# Patient Record
Sex: Male | Born: 1960 | Race: White | Hispanic: No | Smoking: Never smoker
Health system: Southern US, Community
[De-identification: ages and names within clinical notes are randomized; demographics above are authoritative.]

## PROBLEM LIST (undated history)

## (undated) DIAGNOSIS — E349 Endocrine disorder, unspecified: Secondary | ICD-10-CM

## (undated) DIAGNOSIS — E785 Hyperlipidemia, unspecified: Secondary | ICD-10-CM

## (undated) DIAGNOSIS — I10 Essential (primary) hypertension: Secondary | ICD-10-CM

## (undated) DIAGNOSIS — T7840XA Allergy, unspecified, initial encounter: Secondary | ICD-10-CM

## (undated) HISTORY — DX: Allergy, unspecified, initial encounter: T78.40XA

## (undated) HISTORY — DX: Endocrine disorder, unspecified: E34.9

## (undated) HISTORY — DX: Hyperlipidemia, unspecified: E78.5

## (undated) HISTORY — DX: Essential (primary) hypertension: I10

---

## 2005-09-13 ENCOUNTER — Ambulatory Visit: Payer: Self-pay | Admitting: Family Medicine

## 2005-10-17 ENCOUNTER — Ambulatory Visit: Payer: Self-pay | Admitting: Family Medicine

## 2005-12-11 ENCOUNTER — Ambulatory Visit: Payer: Self-pay | Admitting: Internal Medicine

## 2006-01-28 ENCOUNTER — Ambulatory Visit: Payer: Self-pay | Admitting: Internal Medicine

## 2006-01-30 ENCOUNTER — Ambulatory Visit: Payer: Self-pay | Admitting: Internal Medicine

## 2007-04-23 ENCOUNTER — Ambulatory Visit: Payer: Self-pay | Admitting: Internal Medicine

## 2007-04-23 DIAGNOSIS — L909 Atrophic disorder of skin, unspecified: Secondary | ICD-10-CM | POA: Insufficient documentation

## 2007-04-23 DIAGNOSIS — J309 Allergic rhinitis, unspecified: Secondary | ICD-10-CM | POA: Insufficient documentation

## 2007-04-23 DIAGNOSIS — G479 Sleep disorder, unspecified: Secondary | ICD-10-CM | POA: Insufficient documentation

## 2007-04-23 DIAGNOSIS — L919 Hypertrophic disorder of the skin, unspecified: Secondary | ICD-10-CM

## 2007-04-23 DIAGNOSIS — I10 Essential (primary) hypertension: Secondary | ICD-10-CM | POA: Insufficient documentation

## 2007-04-24 LAB — CONVERTED CEMR LAB
Chlamydia, DNA Probe: NEGATIVE
GC Probe Amp, Genital: NEGATIVE

## 2007-10-19 ENCOUNTER — Encounter: Payer: Self-pay | Admitting: Internal Medicine

## 2008-01-11 ENCOUNTER — Ambulatory Visit: Payer: Self-pay | Admitting: Family Medicine

## 2008-01-11 DIAGNOSIS — M5430 Sciatica, unspecified side: Secondary | ICD-10-CM | POA: Insufficient documentation

## 2008-01-11 DIAGNOSIS — M545 Low back pain, unspecified: Secondary | ICD-10-CM | POA: Insufficient documentation

## 2008-02-22 ENCOUNTER — Telehealth: Payer: Self-pay | Admitting: Family Medicine

## 2008-10-06 ENCOUNTER — Telehealth: Payer: Self-pay | Admitting: Internal Medicine

## 2009-01-16 ENCOUNTER — Ambulatory Visit: Payer: Self-pay | Admitting: Internal Medicine

## 2009-01-16 DIAGNOSIS — R002 Palpitations: Secondary | ICD-10-CM | POA: Insufficient documentation

## 2009-01-21 HISTORY — PX: VASECTOMY: SHX75

## 2009-05-17 ENCOUNTER — Telehealth: Payer: Self-pay | Admitting: Internal Medicine

## 2010-01-30 ENCOUNTER — Encounter: Payer: Self-pay | Admitting: Internal Medicine

## 2010-02-18 LAB — CONVERTED CEMR LAB
ALT: 32 units/L (ref 0–53)
AST: 27 units/L (ref 0–37)
Bilirubin, Direct: 0.1 mg/dL (ref 0.0–0.3)
CO2: 30 meq/L (ref 19–32)
Chloride: 104 meq/L (ref 96–112)
Creatinine, Ser: 1.1 mg/dL (ref 0.4–1.5)
Direct LDL: 179.3 mg/dL
Eosinophils Relative: 2.1 % (ref 0.0–5.0)
GFR calc non Af Amer: 76.41 mL/min (ref >60)
HCT: 44.7 % (ref 39.0–52.0)
Lymphs Abs: 1.7 10*3/uL (ref 0.7–4.0)
Monocytes Relative: 5.9 % (ref 3.0–12.0)
Platelets: 199 10*3/uL (ref 150.0–400.0)
Sodium: 140 meq/L (ref 135–145)
Total Bilirubin: 1 mg/dL (ref 0.3–1.2)
Total CHOL/HDL Ratio: 6
WBC: 7.2 10*3/uL (ref 4.5–10.5)

## 2010-02-20 NOTE — Progress Notes (Signed)
Summary: Referral for vasectomy  Phone Note Call from Patient Call back at Home Phone [phone]   Caller: Patient Call For: Cindee Salt MD Summary of Call: Patient would like to be referred to have a vasectomy as soon as possible.  Spoke with Rene Kocher and was advised that as of 11/2008 Dr. Hetty Ely does not do them in the office anymore.  Patient does not have anyone in mind as far as the referral but trust Dr. Karle Starch option/decision.  Please advise.  Initial call taken by: Linde Gillis CMA Duncan Dull),  May 17, 2009 3:23 PM  Follow-up for Phone Call        okay to refer to urology [place] or [place] at his option Follow-up by: Cindee Salt MD,  May 17, 2009 8:15 PM

## 2010-02-22 NOTE — Miscellaneous (Signed)
Summary: Influenza Immunization  Clinical Lists Changes  Observations: Added new observation of FLU VAX: Historical (11/22/2009 10:37)      Influenza Immunization History:    Influenza # 1:  Historical (11/22/2009)

## 2011-10-29 ENCOUNTER — Encounter: Payer: Self-pay | Admitting: Internal Medicine

## 2011-10-29 ENCOUNTER — Ambulatory Visit (INDEPENDENT_AMBULATORY_CARE_PROVIDER_SITE_OTHER): Payer: Federal, State, Local not specified - PPO | Admitting: Internal Medicine

## 2011-10-29 VITALS — BP 126/78 | HR 63 | Temp 98.4°F | Ht 72.0 in | Wt 191.0 lb

## 2011-10-29 DIAGNOSIS — E785 Hyperlipidemia, unspecified: Secondary | ICD-10-CM | POA: Insufficient documentation

## 2011-10-29 DIAGNOSIS — Z23 Encounter for immunization: Secondary | ICD-10-CM

## 2011-10-29 DIAGNOSIS — Z0000 Encounter for general adult medical examination without abnormal findings: Secondary | ICD-10-CM

## 2011-10-29 LAB — LIPID PANEL
Total CHOL/HDL Ratio: 5
VLDL: 26.4 mg/dL (ref 0.0–40.0)

## 2011-10-29 LAB — LDL CHOLESTEROL, DIRECT: Direct LDL: 160.7 mg/dL

## 2011-10-29 LAB — GLUCOSE, RANDOM: Glucose, Bld: 84 mg/dL (ref 70–99)

## 2011-10-29 NOTE — Assessment & Plan Note (Signed)
Healthy Really has worked on fitness Down to ideal body weight Off testosterone so will hold off on PSA (if at all)

## 2011-10-29 NOTE — Progress Notes (Signed)
Subjective:    Patient ID: Randy Dillon, male    DOB: 01-16-1961, 51 y.o.   MRN: [id number]  HPI Here for physical Has been eating better, no alcohol now Now has started yoga recently  Has been taking fish oil Heard about study with link to prostate cancer He stopped it therefore  Takes different vitamins On zinc, magnesium, calcium tab----hoping to help low testosterone (which implants had helped in past) On B complex for energy--he thinks this helps  Trouble with left thumb Constant pain at [facility] Occ sharp pain Seen at [facility] (for sciatica)--told it was probably arthritic  . No current outpatient prescriptions on file prior to visit.    No Known Allergies  Past Medical History  Diagnosis Date  . Allergy   . Hypertension   . Hyperlipidemia   . Testosterone deficiency     Dr Achilles Dunk treated in past    No past surgical history on file.  No family history on file.  History   Social History  . Marital Status: Divorced    Spouse Name: N/A    Number of Children: 2  . Years of Education: N/A   Occupational History  . postal service    Social History Main Topics  . Smoking status: Never Smoker   . Smokeless tobacco: Never Used  . Alcohol Use: No     been 1 year since last drink  . Drug Use: No  . Sexually Active: Not on file   Other Topics Concern  . Not on file   Social History Narrative  . No narrative on file   Review of Systems  Constitutional: Negative for fatigue and unexpected weight change.       Wears seat belt  HENT: Positive for hearing loss, dental problem and tinnitus. Negative for congestion and rhinorrhea.        ?mild hearing loss Regular with dentist--has needed 3 implants Ragweed in past--not as bad this year---no meds now  Eyes: Negative for visual disturbance.       No unilateral vision loss or diplopia  Respiratory: Negative for cough, chest tightness and shortness of breath.   Cardiovascular: Negative for chest pain,  palpitations and leg swelling.  Gastrointestinal: Negative for nausea, vomiting, abdominal pain, constipation and blood in stool.       No heartburn  Genitourinary: Negative for urgency, frequency and difficulty urinating.       Nocturia x 2 stable Libido is down without the testosterone  Musculoskeletal: Positive for back pain and arthralgias. Negative for joint swelling.       Sciatica is better with yoga  Skin: Negative for rash.       Fungal left great toenail---topicals temporary help  Neurological: Positive for headaches. Negative for dizziness, syncope, weakness, light-headedness and numbness.       Occ end of day headache--relates to new glasses  Hematological: Negative for adenopathy. Does not bruise/bleed easily.  Psychiatric/Behavioral: Negative for disturbed wake/sleep cycle and dysphoric mood. The patient is not nervous/anxious.        Objective:   Physical Exam  Constitutional: He is oriented to person, place, and time. He appears well-developed and well-nourished. No distress.  HENT:  Head: Normocephalic and atraumatic.  Right Ear: External ear normal.  Left Ear: External ear normal.  Mouth/Throat: Oropharynx is clear and moist. No oropharyngeal exudate.  Eyes: Conjunctivae normal and EOM are normal. Pupils are equal, round, and reactive to light.  Neck: Normal range of motion. Neck supple. No thyromegaly  present.  Cardiovascular: Normal rate, regular rhythm, normal heart sounds and intact distal pulses.  Exam reveals no gallop.   No murmur heard. Pulmonary/Chest: Effort normal and breath sounds normal. No respiratory distress. He has no wheezes. He has no rales.  Abdominal: Soft. There is no tenderness.  Musculoskeletal: Normal range of motion. He exhibits no edema and no tenderness.  Lymphadenopathy:    He has no cervical adenopathy.  Neurological: He is alert and oriented to person, place, and time.  Skin: No rash noted. No erythema.  Psychiatric: He has a normal  mood and affect. His behavior is normal. Thought content normal.          Assessment & Plan:

## 2011-10-29 NOTE — Patient Instructions (Signed)
You can try over the counter heat cream or capsaicin cream for your left thumb pain

## 2011-10-29 NOTE — Assessment & Plan Note (Signed)
Hopefully better with weight loss and fitness Will recheck

## 2011-10-30 ENCOUNTER — Encounter: Payer: Self-pay | Admitting: *Deleted

## 2013-02-09 ENCOUNTER — Encounter: Payer: Self-pay | Admitting: Internal Medicine

## 2013-02-09 ENCOUNTER — Ambulatory Visit (INDEPENDENT_AMBULATORY_CARE_PROVIDER_SITE_OTHER): Payer: Federal, State, Local not specified - PPO | Admitting: Internal Medicine

## 2013-02-09 VITALS — BP 118/62 | HR 97 | Temp 97.7°F | Ht 72.0 in | Wt 202.0 lb

## 2013-02-09 DIAGNOSIS — Z125 Encounter for screening for malignant neoplasm of prostate: Secondary | ICD-10-CM

## 2013-02-09 DIAGNOSIS — I10 Essential (primary) hypertension: Secondary | ICD-10-CM

## 2013-02-09 DIAGNOSIS — Z0000 Encounter for general adult medical examination without abnormal findings: Secondary | ICD-10-CM

## 2013-02-09 DIAGNOSIS — E785 Hyperlipidemia, unspecified: Secondary | ICD-10-CM

## 2013-02-09 DIAGNOSIS — Z1211 Encounter for screening for malignant neoplasm of colon: Secondary | ICD-10-CM

## 2013-02-09 LAB — CBC WITH DIFFERENTIAL/PLATELET
Basophils Absolute: 0 10*3/uL (ref 0.0–0.1)
Basophils Relative: 0.2 % (ref 0.0–3.0)
EOS PCT: 1.2 % (ref 0.0–5.0)
Eosinophils Absolute: 0.1 10*3/uL (ref 0.0–0.7)
HEMATOCRIT: 48.6 % (ref 39.0–52.0)
Hemoglobin: 16.7 g/dL (ref 13.0–17.0)
LYMPHS ABS: 1.8 10*3/uL (ref 0.7–4.0)
Lymphocytes Relative: 24.2 % (ref 12.0–46.0)
MCHC: 34.4 g/dL (ref 30.0–36.0)
MCV: 85.1 fl (ref 78.0–100.0)
Monocytes Absolute: 0.5 10*3/uL (ref 0.1–1.0)
Monocytes Relative: 7.2 % (ref 3.0–12.0)
NEUTROS ABS: 5.1 10*3/uL (ref 1.4–7.7)
Neutrophils Relative %: 67.2 % (ref 43.0–77.0)
PLATELETS: 198 10*3/uL (ref 150.0–400.0)
RBC: 5.71 Mil/uL (ref 4.22–5.81)
RDW: 12.8 % (ref 11.5–14.6)
WBC: 7.6 10*3/uL (ref 4.5–10.5)

## 2013-02-09 LAB — COMPREHENSIVE METABOLIC PANEL
ALBUMIN: 4.4 g/dL (ref 3.5–5.2)
ALK PHOS: 49 U/L (ref 39–117)
ALT: 38 U/L (ref 0–53)
AST: 28 U/L (ref 0–37)
BUN: 12 mg/dL (ref 6–23)
CO2: 29 mEq/L (ref 19–32)
Calcium: 9.9 mg/dL (ref 8.4–10.5)
Chloride: 103 mEq/L (ref 96–112)
Creatinine, Ser: 1 mg/dL (ref 0.4–1.5)
GFR: 81.96 mL/min (ref >60.00)
Glucose, Bld: 87 mg/dL (ref 70–99)
Potassium: 4.5 mEq/L (ref 3.5–5.1)
SODIUM: 139 meq/L (ref 135–145)
TOTAL PROTEIN: 7.2 g/dL (ref 6.0–8.3)
Total Bilirubin: 1.2 mg/dL (ref 0.3–1.2)

## 2013-02-09 LAB — LIPID PANEL
Cholesterol: 233 mg/dL — ABNORMAL HIGH (ref 0–200)
HDL: 41.2 mg/dL (ref >39.00)
Total CHOL/HDL Ratio: 6
Triglycerides: 154 mg/dL — ABNORMAL HIGH (ref 0.0–149.0)
VLDL: 30.8 mg/dL (ref 0.0–40.0)

## 2013-02-09 LAB — TSH: TSH: 1.6 u[IU]/mL (ref 0.35–5.50)

## 2013-02-09 LAB — LDL CHOLESTEROL, DIRECT: LDL DIRECT: 175.7 mg/dL

## 2013-02-09 LAB — PSA: PSA: 0.99 ng/mL (ref 0.10–4.00)

## 2013-02-09 NOTE — Assessment & Plan Note (Signed)
Discussed primary prevention Will hold off but recheck levels

## 2013-02-09 NOTE — Assessment & Plan Note (Signed)
Fairly healthy Will check PSA after discussion Set up colonoscopy

## 2013-02-09 NOTE — Progress Notes (Signed)
Subjective:    Patient ID: Randy Dillon, male    DOB: [birth year], 53 y.o.   MRN: [id number]  HPI Here for physical Still attentive to health--weight up a little from the holidays Still does yoga  Back on the testosterone from Dr Jacqlyn Larsen Doesn't know if he has had PSA  Ready to have colonoscopy  Current Outpatient Prescriptions on File Prior to Visit  Medication Sig Dispense Refill  . Multiple Vitamins-Minerals (MULTIVITAMIN WITH MINERALS) tablet Take 1 tablet by mouth daily.       No current facility-administered medications on file prior to visit.    No Known Allergies  Past Medical History  Diagnosis Date  . Allergy   . Hypertension   . Hyperlipidemia   . Testosterone deficiency     Dr Jacqlyn Larsen treated in past    No past surgical history on file.  No family history on file.  History   Social History  . Marital Status: Married    Spouse Name: N/A    Number of Children: 2  . Years of Education: N/A   Occupational History  . postal service    Social History Main Topics  . Smoking status: Never Smoker   . Smokeless tobacco: Never Used  . Alcohol Use: No     Comment: been 1 year since last drink  . Drug Use: No  . Sexual Activity: Not on file   Other Topics Concern  . Not on file   Social History Narrative   Divorced   Remarried 2012   Review of Systems  Constitutional: Negative for fatigue and unexpected weight change.       Wears seat belt  HENT: Positive for hearing loss and tinnitus. Negative for dental problem.        Keeps up with dentist  Eyes: Negative for visual disturbance.       No diplopia or unilateral vision loss Recent exam  Respiratory: Negative for cough, chest tightness and shortness of breath.   Cardiovascular: Negative for chest pain, palpitations and leg swelling.  Gastrointestinal: Negative for nausea, vomiting, abdominal pain, constipation and blood in stool.       Occasional heartburn---uses tums once a month or so  Endocrine:  Negative for cold intolerance and heat intolerance.  Genitourinary: Negative for urgency, frequency and difficulty urinating.  Musculoskeletal: Positive for arthralgias. Negative for back pain and joint swelling.       Left thumb  Skin: Negative for rash.       No suspicious lesions  Allergic/Immunologic: Positive for environmental allergies. Negative for immunocompromised state.       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  Neurological: Negative for dizziness, syncope, weakness, light-headedness, numbness and headaches.  Hematological: Negative for adenopathy. Does not bruise/bleed easily.  Psychiatric/Behavioral: Negative for sleep disturbance and dysphoric mood. The patient is not nervous/anxious.        Objective:   Physical Exam  Constitutional: He is oriented to person, place, and time. He appears well-developed and well-nourished. No distress.  HENT:  Head: Normocephalic and atraumatic.  Right Ear: External ear normal.  Left Ear: External ear normal.  Mouth/Throat: Oropharynx is clear and moist. No oropharyngeal exudate.  Eyes: Conjunctivae and EOM are normal. Pupils are equal, round, and reactive to light.  Neck: Normal range of motion. Neck supple. No thyromegaly present.  Cardiovascular: Normal rate, regular rhythm, normal heart sounds and intact distal pulses.  Exam reveals no gallop.   No murmur heard. Pulmonary/Chest: Effort normal and breath sounds normal. No  respiratory distress. He has no wheezes. He has no rales.  Abdominal: Soft. There is no tenderness.  Musculoskeletal: He exhibits no edema and no tenderness.  Lymphadenopathy:    He has no cervical adenopathy.  Neurological: He is alert and oriented to person, place, and time.  Skin: No rash noted. No erythema.  Psychiatric: He has a normal mood and affect. His behavior is normal.          Assessment & Plan:

## 2013-02-09 NOTE — Assessment & Plan Note (Signed)
BP Readings from Last 3 Encounters:  02/09/13 118/62  10/29/11 126/78  01/16/09 122/80   Still good without Rx

## 2013-02-09 NOTE — Progress Notes (Signed)
Pre-visit discussion using our clinic review tool. No additional management support is needed unless otherwise documented below in the visit note.  

## 2013-02-24 ENCOUNTER — Telehealth: Payer: Self-pay | Admitting: Internal Medicine

## 2013-02-24 NOTE — Telephone Encounter (Signed)
Relevant patient education mailed to patient.  

## 2013-04-30 ENCOUNTER — Ambulatory Visit: Payer: Self-pay | Admitting: Gastroenterology

## 2013-04-30 LAB — HM COLONOSCOPY: HM COLON: NORMAL

## 2013-05-12 ENCOUNTER — Encounter: Payer: Self-pay | Admitting: Internal Medicine

## 2013-11-23 ENCOUNTER — Telehealth: Payer: Self-pay | Admitting: Internal Medicine

## 2013-11-23 ENCOUNTER — Ambulatory Visit (INDEPENDENT_AMBULATORY_CARE_PROVIDER_SITE_OTHER)
Admission: RE | Admit: 2013-11-23 | Discharge: 2013-11-23 | Disposition: A | Discharge status: Home / Self Care | Payer: Federal, State, Local not specified - PPO | Source: Ambulatory Visit | Attending: Family Medicine | Admitting: Family Medicine

## 2013-11-23 ENCOUNTER — Encounter: Payer: Self-pay | Admitting: Family Medicine

## 2013-11-23 ENCOUNTER — Ambulatory Visit (INDEPENDENT_AMBULATORY_CARE_PROVIDER_SITE_OTHER): Payer: Federal, State, Local not specified - PPO | Admitting: Family Medicine

## 2013-11-23 VITALS — BP 118/78 | HR 64 | Temp 98.4°F | Wt 205.0 lb

## 2013-11-23 DIAGNOSIS — M5431 Sciatica, right side: Secondary | ICD-10-CM

## 2013-11-23 DIAGNOSIS — M545 Low back pain: Secondary | ICD-10-CM

## 2013-11-23 MED ORDER — PREDNISONE 20 MG PO TABS: ORAL_TABLET | ORAL | Status: DC

## 2013-11-23 MED ORDER — TRAMADOL HCL 50 MG PO TABS: 50.0000 mg | ORAL_TABLET | Freq: Three times a day (TID) | ORAL | Status: DC | PRN

## 2013-11-23 NOTE — Telephone Encounter (Signed)
Please call in Tramadol rx. Thanks.

## 2013-11-23 NOTE — Telephone Encounter (Signed)
Pt called back wanting dr Damita Dunnings would call him in something for his back pain. [facility] [address]

## 2013-11-23 NOTE — Progress Notes (Signed)
Pre visit review using our clinic review tool, if applicable. No additional management support is needed unless otherwise documented below in the visit note.  1.5 months of lower back pain.  H/o sciatica over the last few decades.  R lower back pain.  It feels like sciatica again. Occ shooting down the R leg.  No L leg sx.  No trauma, no trigger.  No rash.  No B/B sx.  Does yoga, active at baseline.  No weakness in BLE.  Some numbness in the R leg.  Worse with sitting.    Meds, vitals, and allergies reviewed.   ROS: See HPI.  Otherwise, noncontributory.  nad ncat rrr ctab abd soft Back w/o midline pain,no bruising no rash R SLR leg, pain with facet loading noted. Distally NV intact in the BLE

## 2013-11-23 NOTE — Patient Instructions (Signed)
Go to the lab on the way out.  We'll contact you with your xray report. Take prednisone with food and notify us if not better. Take care.

## 2013-11-24 DIAGNOSIS — M549 Dorsalgia, unspecified: Secondary | ICD-10-CM | POA: Insufficient documentation

## 2013-11-24 NOTE — Assessment & Plan Note (Signed)
Sounds to have contribution from R sciatica and facet pain.  Noted SLR neg at time of exam.  D/w pt.  Okay for outpatient f/u. Check L spine series today, see notes on imaging.  pred with steroid caution, can [place] tramadol prn. He agrees. He'll update [place] as needed.

## 2013-11-24 NOTE — Telephone Encounter (Signed)
Rx called to pharmacy. Patient notified by telephone. 

## 2014-01-18 ENCOUNTER — Telehealth: Payer: Self-pay | Admitting: Internal Medicine

## 2014-01-18 DIAGNOSIS — M5441 Lumbago with sciatica, right side: Secondary | ICD-10-CM

## 2014-01-18 NOTE — Telephone Encounter (Signed)
Pt wife called requesting referral to back specialist. Pt prefers to go to [place]. Please advise.

## 2014-01-24 NOTE — Telephone Encounter (Signed)
No back Dr in [place], [place] made with Dr Lynann Bologna for 01/25/14.

## 2014-01-25 ENCOUNTER — Ambulatory Visit: Payer: Federal, State, Local not specified - PPO | Admitting: Internal Medicine

## 2014-01-26 ENCOUNTER — Other Ambulatory Visit: Payer: Self-pay | Admitting: Orthopedic Surgery

## 2014-01-26 DIAGNOSIS — M5416 Radiculopathy, lumbar region: Secondary | ICD-10-CM

## 2014-01-27 ENCOUNTER — Ambulatory Visit
Admission: RE | Admit: 2014-01-27 | Discharge: 2014-01-27 | Disposition: A | Discharge status: Home / Self Care | Payer: Federal, State, Local not specified - PPO | Source: Ambulatory Visit | Attending: Orthopedic Surgery | Admitting: Orthopedic Surgery

## 2014-01-27 DIAGNOSIS — M5416 Radiculopathy, lumbar region: Secondary | ICD-10-CM

## 2014-03-22 HISTORY — PX: CYSTECTOMY: SUR359

## 2014-05-24 ENCOUNTER — Encounter (HOSPITAL_COMMUNITY): Payer: Self-pay | Admitting: *Deleted

## 2014-05-24 ENCOUNTER — Emergency Department (HOSPITAL_COMMUNITY)
Admission: EM | Admit: 2014-05-24 | Discharge: 2014-05-24 | Disposition: A | Discharge status: Home / Self Care | Payer: Worker's Compensation | Attending: Emergency Medicine | Admitting: Emergency Medicine

## 2014-05-24 DIAGNOSIS — Y990 Civilian activity done for income or pay: Secondary | ICD-10-CM | POA: Diagnosis not present

## 2014-05-24 DIAGNOSIS — W540XXA Bitten by dog, initial encounter: Secondary | ICD-10-CM | POA: Diagnosis not present

## 2014-05-24 DIAGNOSIS — I10 Essential (primary) hypertension: Secondary | ICD-10-CM | POA: Insufficient documentation

## 2014-05-24 DIAGNOSIS — S31159A Open bite of abdominal wall, unspecified quadrant without penetration into peritoneal cavity, initial encounter: Secondary | ICD-10-CM

## 2014-05-24 DIAGNOSIS — Y9389 Activity, other specified: Secondary | ICD-10-CM | POA: Diagnosis not present

## 2014-05-24 DIAGNOSIS — S31154A Open bite of abdominal wall, left lower quadrant without penetration into peritoneal cavity, initial encounter: Secondary | ICD-10-CM | POA: Insufficient documentation

## 2014-05-24 DIAGNOSIS — Y9289 Other specified places as the place of occurrence of the external cause: Secondary | ICD-10-CM | POA: Diagnosis not present

## 2014-05-24 DIAGNOSIS — Z8639 Personal history of other endocrine, nutritional and metabolic disease: Secondary | ICD-10-CM | POA: Insufficient documentation

## 2014-05-24 MED ORDER — AMOXICILLIN-POT CLAVULANATE 875-125 MG PO TABS: 1.0000 | ORAL_TABLET | Freq: Two times a day (BID) | ORAL | Status: DC

## 2014-05-24 MED ORDER — AMOXICILLIN-POT CLAVULANATE 875-125 MG PO TABS
1.0000 | ORAL_TABLET | Freq: Once | ORAL | Status: AC
  Administered 2014-05-24: 1 via ORAL
  Filled 2014-05-24: qty 1

## 2014-05-24 NOTE — ED Notes (Signed)
PT STATES DOG SITTER ADVISED DOG IS UP TO DATE ON SHOTS. STATES DOG SITTER REPORTED HE HAD TAKEN THE DOG TO VET THAT AM PRIOR TO INCIDENT D/T DOG HAD EATEN VITAMINS.

## 2014-05-24 NOTE — ED Notes (Signed)
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

## 2014-05-24 NOTE — Discharge Instructions (Signed)
Please return to start your rabies vaccinations if you find out the dog was not vaccinated. Please take your antibiotic until completion. Please read all discharge instructions and return precautions.    Animal Bite An animal bite can result in a scratch on the skin, deep open cut, puncture of the skin, crush injury, or tearing away of the skin or a body part. Dogs are responsible for most animal bites. Children are bitten more often than adults. An animal bite can range from very mild to more serious. A small bite from your house pet is no cause for alarm. However, some animal bites can become infected or injure a bone or other tissue. You must seek medical care if:  The skin is broken and bleeding does not slow down or stop after 15 minutes.  The puncture is deep and difficult to clean (such as a cat bite).  Pain, warmth, redness, or pus develops around the wound.  The bite is from a stray animal or rodent. There may be a risk of rabies infection.  The bite is from a snake, raccoon, skunk, fox, coyote, or bat. There may be a risk of rabies infection.  The person bitten has a chronic illness such as diabetes, liver disease, or cancer, or the person takes medicine that lowers the immune system.  There is concern about the location and severity of the bite. It is important to clean and protect an animal bite wound right away to prevent infection. Follow these steps:  Clean the wound with plenty of water and soap.  Apply an antibiotic cream.  Apply gentle pressure over the wound with a clean towel or gauze to slow or stop bleeding.  Elevate the affected area above the heart to help stop any bleeding.  Seek medical care. Getting medical care within 8 hours of the animal bite leads to the best possible outcome. DIAGNOSIS  Your caregiver will most likely:  Take a detailed history of the animal and the bite injury.  Perform a wound exam.  Take your medical history. Blood tests or  X-rays may be performed. Sometimes, infected bite wounds are cultured and sent to a lab to identify the infectious bacteria.  TREATMENT  Medical treatment will depend on the location and type of animal bite as well as the patient's medical history. Treatment may include:  Wound care, such as cleaning and flushing the wound with saline solution, bandaging, and elevating the affected area.  Antibiotics.  Tetanus immunization.  Rabies immunization.  Leaving the wound open to heal. This is often done with animal bites, due to the high risk of infection. However, in certain cases, wound closure with stitches, wound adhesive, skin adhesive strips, or staples may be used. Infected bites that are left untreated may require intravenous (IV) antibiotics and surgical treatment in the hospital. [place]  Follow your caregiver's instructions for wound care.  Take all medicines as directed.  If your caregiver prescribes antibiotics, take them as directed. Finish them even if you start to feel better.  Follow up with your caregiver for further exams or immunizations as directed. You may need a tetanus shot if:  You cannot remember when you had your last tetanus shot.  You have never had a tetanus shot.  The injury broke your skin. If you get a tetanus shot, your arm may swell, get red, and feel warm to the touch. This is common and not a problem. If you need a tetanus shot and you choose not  to have one, there is a rare chance of getting tetanus. Sickness from tetanus can be serious. SEEK MEDICAL CARE IF:  You notice warmth, redness, soreness, swelling, pus discharge, or a bad smell coming from the wound.  You have a red line on the skin coming from the wound.  You have a fever, chills, or a general ill feeling.  You have nausea or vomiting.  You have continued or worsening pain.  You have trouble moving the injured part.  You have other questions or concerns. MAKE SURE  YOU:  Understand these instructions.  Will watch your condition.  Will get help right away if you are not doing well or get worse. Document Released: 09/25/2010 Document Revised: 04/01/2011 Document Reviewed: 09/25/2010 [facility] Patient Information 2015 [place], [place]. This information is not intended to replace advice given to you by your health care provider. Make sure you discuss any questions you have with your health care provider.

## 2014-05-24 NOTE — ED Provider Notes (Signed)
CSN: [id number]     Arrival date & time 05/24/14  4008 History  This chart was scribed for Baron Sane, PA-C working with Varney Biles, MD by Randa Evens, ED Scribe. This patient was seen in room TR07C/TR07C and the patient's care was started at 9:31 AM.    Chief Complaint  Patient presents with  . Animal Bite   HPI Comments: Patient is a 54 yo M PMHx significant for HTN, HLD presenting to the ED for evaluation of dog bite to his abdomen that occurred yesterday. 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 Denies any pain, fever, drainage from site. No other complaints or injuries. PT STATES DOG SITTER ADVISED DOG IS UP TO DATE ON SHOTS. STATES DOG SITTER REPORTED HE HAD TAKEN THE DOG TO VET THAT AM PRIOR TO INCIDENT D/T DOG HAD EATEN VITAMINS. Tdap is UTD  Patient is a 54 y.o. male presenting with animal bite. The history is provided by the patient. No language interpreter was used.  Animal Bite Contact animal:  Dog Location:  Torso Torso injury location:  Abdomen Time since incident:  1 day Pain details:    Severity:  No pain Incident location:  Another residence and work Animal's rabies vaccination status:  Up to date Relieved by:  None tried Worsened by:  Nothing tried Ineffective treatments:  None tried Associated symptoms: no fever    HPI Comments: Randy Dillon is a 54 y.o. male who presents to the Emergency Department complaining of dog bite 1 day ago at 1:30 PM. Pt states that he was bitten on the abdomen by a [employer]. Pt presents with a small abrasion to his LLQ.  Pt denies pain or drainage from wound. Pt denies fever, abdominal pain or HA. Pt states that he believes that he believes that the dog is UTD on vaccinations.   Past Medical History  Diagnosis Date  . Allergy   . Hypertension   . Hyperlipidemia   . Testosterone deficiency     Dr Jacqlyn Larsen treated in past   Past Surgical History  Procedure Laterality  Date  . Cystectomy  03/2014    cyst removed from lower lumbar  . Vasectomy Bilateral 2011   No family history on file. History  Substance Use Topics  . Smoking status: Never Smoker   . Smokeless tobacco: Never Used  . Alcohol Use: No    Review of Systems  Constitutional: Negative for fever.  Gastrointestinal: Negative for abdominal pain.  Skin: Positive for wound.  Neurological: Negative for headaches.  All other systems reviewed and are negative.    Allergies  Review of patient's allergies indicates no known allergies.  Home Medications   Prior to Admission medications   Medication Sig Start Date End Date Taking? Authorizing Provider  amoxicillin-clavulanate (AUGMENTIN) 875-125 MG per tablet Take 1 tablet by mouth every 12 (twelve) hours. 05/24/14   Alyse Kathan, PA-C  ibuprofen (ADVIL,MOTRIN) 200 MG tablet Take 200 mg by mouth every 6 (six) hours as needed.    Historical Provider, MD  predniSONE (DELTASONE) 20 MG tablet 3 a day for 3 days, then 2 a day for 3 days, then 1 a day for 3 days, with food. 11/23/13   Tonia Ghent, MD  testosterone cypionate (DEPOTESTOTERONE CYPIONATE) 200 MG/ML injection Inject 100 mg into the muscle once a week.  11/16/13   Historical Provider, MD  traMADol (ULTRAM) 50 MG tablet Take 1 tablet (50 mg  total) by mouth every 8 (eight) hours as needed for moderate pain (sedation caution). 11/23/13   Tonia Ghent, MD   BP 132/84 mmHg  Pulse 74  Temp(Src) 98.6 F (37 C) (Oral)  Resp 16  SpO2 100%   Physical Exam  Constitutional: He is oriented to person, place, and time. He appears well-developed and well-nourished. No distress.  HENT:  Head: Normocephalic and atraumatic.  Right Ear: External ear normal.  Left Ear: External ear normal.  Nose: Nose normal.  Mouth/Throat: Oropharynx is clear and moist.  Eyes: Conjunctivae are normal.  Neck: Normal range of motion. Neck supple.  No nuchal rigidity.   Cardiovascular: Normal rate.    Pulmonary/Chest: Effort normal.  Abdominal: Soft. There is no tenderness. There is no rebound and no guarding.    Musculoskeletal: Normal range of motion.  Neurological: He is alert and oriented to person, place, and time.  Skin: Skin is warm and dry. He is not diaphoretic.  Psychiatric: He has a normal mood and affect.  Nursing note and vitals reviewed.   ED Course  Procedures (including critical care time) Medications - No data to display  DIAGNOSTIC STUDIES: Oxygen Saturation is 100% on RA, normal by my interpretation.    COORDINATION OF CARE: 9:41 AM-Discussed treatment plan with pt at bedside and pt agreed to plan.     Labs Review Labs Reviewed - No data to display  Imaging Review No results found.   EKG Interpretation None      MDM   Final diagnoses:  Animal bite of abdomen, initial encounter    Filed Vitals:   05/24/14 0954  BP: 132/84  Pulse:   Temp:   Resp:    Afebrile, NAD, non-toxic appearing, AAOx4.   Patient presenting to the ER for evaluation of dog bite to the abdomen that occurred yesterday. Marks noted on abdomen with slight bruising. No erythema, warmth, drainage, or fever to suggest infection. Tdap UTD. Dog UTD on rabies vaccinations. Will place on antibiotics. Return precautions discussed. Patient is agreeable to plan. Patient is stable at time of discharge     I personally performed the services described in this documentation, which was scribed in my presence. The recorded information has been reviewed and is accurate.       Baron Sane, PA-C 05/24/14 [place], MD 05/25/14 [phone]

## 2014-10-05 ENCOUNTER — Emergency Department (HOSPITAL_COMMUNITY)
Admission: EM | Admit: 2014-10-05 | Discharge: 2014-10-05 | Disposition: A | Discharge status: Home / Self Care | Attending: Emergency Medicine | Admitting: Emergency Medicine

## 2014-10-05 ENCOUNTER — Encounter (HOSPITAL_COMMUNITY): Payer: Self-pay | Admitting: Emergency Medicine

## 2014-10-05 DIAGNOSIS — Y998 Other external cause status: Secondary | ICD-10-CM | POA: Insufficient documentation

## 2014-10-05 DIAGNOSIS — Y9389 Activity, other specified: Secondary | ICD-10-CM | POA: Insufficient documentation

## 2014-10-05 DIAGNOSIS — S01511A Laceration without foreign body of lip, initial encounter: Secondary | ICD-10-CM | POA: Diagnosis not present

## 2014-10-05 DIAGNOSIS — W010XXA Fall on same level from slipping, tripping and stumbling without subsequent striking against object, initial encounter: Secondary | ICD-10-CM | POA: Insufficient documentation

## 2014-10-05 DIAGNOSIS — Z23 Encounter for immunization: Secondary | ICD-10-CM | POA: Diagnosis not present

## 2014-10-05 DIAGNOSIS — I10 Essential (primary) hypertension: Secondary | ICD-10-CM | POA: Diagnosis not present

## 2014-10-05 DIAGNOSIS — Z8639 Personal history of other endocrine, nutritional and metabolic disease: Secondary | ICD-10-CM | POA: Insufficient documentation

## 2014-10-05 DIAGNOSIS — Y9289 Other specified places as the place of occurrence of the external cause: Secondary | ICD-10-CM | POA: Insufficient documentation

## 2014-10-05 MED ORDER — LIDOCAINE-EPINEPHRINE (PF) 2 %-1:200000 IJ SOLN
10.0000 mL | Freq: Once | INTRAMUSCULAR | Status: AC
  Administered 2014-10-05: 10 mL
  Filled 2014-10-05: qty 20

## 2014-10-05 MED ORDER — TETANUS-DIPHTH-ACELL PERTUSSIS 5-2.5-18.5 LF-MCG/0.5 IM SUSP
0.5000 mL | Freq: Once | INTRAMUSCULAR | Status: AC
  Administered 2014-10-05: 0.5 mL via INTRAMUSCULAR
  Filled 2014-10-05: qty 0.5

## 2014-10-05 NOTE — ED Notes (Signed)
Tripped over a garden hose, fell against a flower pot. Laceration approx 1" to left upper lip, not near lip line.

## 2014-10-05 NOTE — ED Provider Notes (Signed)
CSN: [id number]     Arrival date & time 10/05/14  1128 History  This chart was scribed for non-physician practitioner Comer Locket, PA-C, working with Orlie Dakin, MD, by Eustaquio Maize, ED Scribe. This patient was seen in room TR11C/TR11C and the patient's care was started at 1:23 PM.   Chief Complaint  Patient presents with  . Lip Laceration   The history is provided by the patient. No language interpreter was used.     HPI Comments: Randy Dillon is a 54 y.o. male who presents to the Emergency Department complaining of left upper lip laceration s/p ground level fall that occurred earlier today. Pt states he tripped over a garden hose and fell against a flow pot, causing the laceration. Pt did not have anything to stopped the bleeding after onset. He wiped his lip on the sleeve of his shirt and returned to the office prior to coming to the ED. Bleeding is now controlled. He rates the pain as a 4/10 on the pain scale. He also notes numbness to the area but denies tingling. No LOC. Pt is unsure if he is UTD on tetanus.    Past Medical History  Diagnosis Date  . Allergy   . Hypertension   . Hyperlipidemia   . Testosterone deficiency     Dr Jacqlyn Larsen treated in past   Past Surgical History  Procedure Laterality Date  . Cystectomy  03/2014    cyst removed from lower lumbar  . Vasectomy Bilateral 2011   No family history on file. Social History  Substance Use Topics  . Smoking status: Never Smoker   . Smokeless tobacco: Never Used  . Alcohol Use: No    Review of Systems  Skin: Positive for wound.  Neurological: Positive for numbness. Negative for syncope.  All other systems reviewed and are negative.  Allergies  Review of patient's allergies indicates no known allergies.  Home Medications   Prior to Admission medications   Medication Sig Start Date End Date Taking? Authorizing Provider  amoxicillin-clavulanate (AUGMENTIN) 875-125 MG per tablet Take 1 tablet by mouth every 12  (twelve) hours. 05/24/14   Jennifer Piepenbrink, PA-C  ibuprofen (ADVIL,MOTRIN) 200 MG tablet Take 200 mg by mouth every 6 (six) hours as needed.    Historical Provider, MD  predniSONE (DELTASONE) 20 MG tablet 3 a day for 3 days, then 2 a day for 3 days, then 1 a day for 3 days, with food. 11/23/13   Tonia Ghent, MD  testosterone cypionate (DEPOTESTOTERONE CYPIONATE) 200 MG/ML injection Inject 100 mg into the muscle once a week.  11/16/13   Historical Provider, MD  traMADol (ULTRAM) 50 MG tablet Take 1 tablet (50 mg total) by mouth every 8 (eight) hours as needed for moderate pain (sedation caution). 11/23/13   Tonia Ghent, MD   Triage Vitals: BP 131/80 mmHg  Pulse 81  Temp(Src) 98.7 F (37.1 C) (Oral)  Resp 16  Ht 6' (1.829 m)  Wt 192 lb (87.091 kg)  BMI 26.03 kg/m2  SpO2 98%   Physical Exam  Constitutional: He is oriented to person, place, and time. He appears well-developed and well-nourished. No distress.  HENT:  Head: Normocephalic and atraumatic.  Small linear laceration approximately 2 cm in length just superior to left upper lip. Does not involve vermilion border. No penetration through to oral mucosa.  Eyes: Conjunctivae and EOM are normal.  Neck: Neck supple. No tracheal deviation present.  Cardiovascular: Normal rate and regular rhythm.   Pulmonary/Chest:  Effort normal. No respiratory distress.  Musculoskeletal: Normal range of motion.  Neurological: He is alert and oriented to person, place, and time.  Skin: Skin is warm and dry.  Psychiatric: He has a normal mood and affect. His behavior is normal.  Nursing note and vitals reviewed.   ED Course  Procedures (including critical care time) LACERATION REPAIR Performed by: Verl Dicker Authorized by: Verl Dicker Consent: Verbal consent obtained. Risks and benefits: risks, benefits and alternatives were discussed Consent given by: patient Patient identity confirmed: provided demographic data Prepped  and Draped in normal sterile fashion Wound explored  Laceration Location: Upper left lip  Laceration Length: 2 cm  No Foreign Bodies seen or palpated  Anesthesia: local infiltration  Local anesthetic: lidocaine 1 % with epinephrine  Anesthetic total: 4 ml  Irrigation method: syringe Amount of cleaning: standard  Skin closure: 6-0 Prolene   Number of sutures: 4   Technique: Simple interrupted   Patient tolerance: Patient tolerated the procedure well with no immediate complications.  DIAGNOSTIC STUDIES: Oxygen Saturation is 98% on RA, normal by my interpretation.    COORDINATION OF CARE: 1:26 PM-Discussed treatment plan which includes suture placement with pt at bedside and pt agreed to plan.   Labs Review Labs Reviewed - No data to display  Imaging Review No results found. I have personally reviewed and evaluated these images and lab results as part of my medical decision-making.   EKG Interpretation None     Meds given in ED:  Medications  lidocaine-EPINEPHrine (XYLOCAINE W/EPI) 2 %-1:200000 (PF) injection 10 mL (10 mLs Other Given by Other 10/05/14 1219)  Tdap (BOOSTRIX) injection 0.5 mL (0.5 mLs Intramuscular Given 10/05/14 1354)    Discharge Medication List as of 10/05/2014  2:02 PM     Filed Vitals:   10/05/14 1141 10/05/14 1417  BP: 131/80 132/78  Pulse: 81 82  Temp: 98.7 F (37.1 C)   TempSrc: Oral   Resp: 16 16  Height: 6' (1.829 m)   Weight: 192 lb (87.091 kg)   SpO2: 98% 98%    MDM  Vitals stable - WNL -afebrile Pt resting comfortably in ED. Tetanus updated in ED Small laceration to left upper lip repaired by myself at bedside. 4 sutures placed. Bacitracin topical antibiotic. Return in 7 days for suture removal. Discussed return precautions for infection. Patient agrees with plan. No evidence of other acute or emergent pathology at this time. Final diagnoses:  Lip laceration, initial encounter    I personally performed the services  described in this documentation, which was scribed in my presence. The recorded information has been reviewed and is accurate.      Comer Locket, PA-C 10/05/14 1726  Orlie Dakin, MD 10/05/14 1734

## 2014-10-05 NOTE — Discharge Instructions (Signed)
Return to your primary care or other medical facility in 7 days to have sutures removed. Return sooner if you see increased redness, tenderness, or cloudy drainage coming from the wound.  Facial Laceration A facial laceration is a cut on the face. These injuries can be painful and cause bleeding. Some cuts may need to be closed with stitches (sutures), skin adhesive strips, or wound glue. Cuts usually heal quickly but can leave a scar. It can take 1-2 years for the scar to go away completely. HOME CARE   Only take medicines as told by your doctor.  Follow your doctor's instructions for wound care. For Stitches:  Keep the cut clean and dry.  If you have a bandage (dressing), change it at least once a day. Change the bandage if it gets wet or dirty, or as told by your doctor.  Wash the cut with soap and water 2 times a day. Rinse the cut with water. Pat it dry with a clean towel.  Put a thin layer of medicated cream on the cut as told by your doctor.  You may shower after the first 24 hours. Do not soak the cut in water until the stitches are removed.  Have your stitches removed as told by your doctor.  Do not wear any makeup until a few days after your stitches are removed. For Skin Adhesive Strips:  Keep the cut clean and dry.  Do not get the strips wet. You may take a bath, but be careful to keep the cut dry.  If the cut gets wet, pat it dry with a clean towel.  The strips will fall off on their own. Do not remove the strips that are still stuck to the cut. For Wound Glue:  You may shower or take baths. Do not soak or scrub the cut. Do not swim. Avoid heavy sweating until the glue falls off on its own. After a shower or bath, pat the cut dry with a clean towel.  Do not put medicine or makeup on your cut until the glue falls off.  If you have a bandage, do not put tape over the glue.  Avoid lots of sunlight or tanning lamps until the glue falls off.  The glue will fall off  on its own in 5-10 days. Do not pick at the glue. After Healing: Put sunscreen on the cut for the first year to reduce your scar. GET HELP RIGHT AWAY IF:   Your cut area gets red, painful, or puffy (swollen).  You see a yellowish-white fluid (pus) coming from the cut.  You have chills or a fever. MAKE SURE YOU:   Understand these instructions.  Will watch your condition.  Will get help right away if you are not doing well or get worse. Document Released: 06/26/2007 Document Revised: 10/28/2012 Document Reviewed: 08/20/2012 [facility] Patient Information 2015 [place], [place]. This information is not intended to replace advice given to you by your health care provider. Make sure you discuss any questions you have with your health care provider.

## 2015-11-15 DIAGNOSIS — K115 Sialolithiasis: Secondary | ICD-10-CM | POA: Diagnosis not present

## 2016-02-02 DIAGNOSIS — J069 Acute upper respiratory infection, unspecified: Secondary | ICD-10-CM | POA: Diagnosis not present

## 2016-02-05 DIAGNOSIS — L03032 Cellulitis of left toe: Secondary | ICD-10-CM | POA: Diagnosis not present

## 2016-02-05 DIAGNOSIS — J069 Acute upper respiratory infection, unspecified: Secondary | ICD-10-CM | POA: Diagnosis not present

## 2016-02-15 DIAGNOSIS — H5213 Myopia, bilateral: Secondary | ICD-10-CM | POA: Diagnosis not present

## 2016-03-04 ENCOUNTER — Encounter: Payer: Self-pay | Admitting: Urology

## 2016-03-04 ENCOUNTER — Ambulatory Visit (INDEPENDENT_AMBULATORY_CARE_PROVIDER_SITE_OTHER): Payer: Federal, State, Local not specified - PPO | Admitting: Urology

## 2016-03-04 DIAGNOSIS — Z125 Encounter for screening for malignant neoplasm of prostate: Secondary | ICD-10-CM | POA: Insufficient documentation

## 2016-03-04 DIAGNOSIS — N5201 Erectile dysfunction due to arterial insufficiency: Secondary | ICD-10-CM | POA: Diagnosis not present

## 2016-03-04 DIAGNOSIS — E291 Testicular hypofunction: Secondary | ICD-10-CM

## 2016-03-04 NOTE — Progress Notes (Signed)
03/04/2016 7:51 AM   Randy Dillon 1960-11-07 [id number]  Referring provider: Venia Carbon, MD [place], [place] [address]  CC: New Patient   HPI:  1. Hypogonadism in male - On T cypionate 200mg  Q 2 weeks for symptoms of severe fatigue and low libido that he states are drastically improved on therapy,  initially managed by [facility] / Jacqlyn Larsen. No records of baseline testing. He has been compliant with mandatory lab surveillance.  He has also used testopel in past and wants to try that again if possible.   08/2014 - T 380, PSA 0.8, Hgb 16 (on therapy [facility] labs) 02/2016 - T,E2, PRL, LH, FSH, CMP, Hgb, PSA - pending and off therapy x 4 mos / DRE 45gm smooth.   2. Erectile dysfunction due to arterial insufficiency - On Viagra prn for inability to maintain erection. He states he thinks this is better on testosterone.   3. Prostate cancer screening - No FHX prostate cancer. On biannual screening given use of high risk medication (testosterone)  PMH sig for back surgery (no deficits). NO ischemic CV disease. NO blood thinners. His PCP is Viviana Simpler MD.  Today "Randy Dillon" is seen as new patient for above. He is self-referred. He wants to transfer care back to [facility] for convenience.     PMH: Past Medical History:  Diagnosis Date  . Allergy   . Hyperlipidemia   . Hypertension   . Testosterone deficiency    Dr Jacqlyn Larsen treated in past    Surgical History: Past Surgical History:  Procedure Laterality Date  . CYSTECTOMY  03/2014   cyst removed from lower lumbar  . VASECTOMY Bilateral 2011    Home Medications:  Allergies as of 03/04/2016   No Known Allergies     Medication List       Accurate as of 03/04/16  7:51 AM. Always use your most recent med list.          amoxicillin-clavulanate 875-125 MG tablet Commonly known as:  AUGMENTIN Take 1 tablet by mouth every 12 (twelve) hours.   ibuprofen 200 MG tablet Commonly known as:  ADVIL,MOTRIN Take 200 mg  by mouth every 6 (six) hours as needed.   predniSONE 20 MG tablet Commonly known as:  DELTASONE 3 a day for 3 days, then 2 a day for 3 days, then 1 a day for 3 days, with food.   testosterone cypionate 200 MG/ML injection Commonly known as:  DEPOTESTOSTERONE CYPIONATE Inject 100 mg into the muscle once a week.   traMADol 50 MG tablet Commonly known as:  ULTRAM Take 1 tablet (50 mg total) by mouth every 8 (eight) hours as needed for moderate pain (sedation caution).       Allergies: No Known Allergies  Family History: No family history on file.  Social History:  reports that he has never smoked. He has never used smokeless tobacco. He reports that he does not drink alcohol or use drugs.   Review of Systems  Gastrointestinal (upper)  : Negative for upper GI symptoms  Gastrointestinal (lower) : Negative for lower GI symptoms  Constitutional : Negative for symptoms  Skin: Negative for skin symptoms  Eyes: Negative for eye symptoms  Ear/Nose/Throat : Negative for Ear/Nose/Throat symptoms  Hematologic/Lymphatic: Negative for Hematologic/Lymphatic symptoms  Cardiovascular : Negative for cardiovascular symptoms  Respiratory : Negative for respiratory symptoms  Endocrine: Negative for endocrine symptoms  Musculoskeletal: Negative for musculoskeletal symptoms  Neurological: Negative for neurological symptoms  Psychologic:  Negative for psychiatric symptoms  Physical Exam: There were no vitals taken for this visit.  Constitutional:  Alert and oriented, No acute distress. HEENT: [place] AT, moist mucus membranes.  Trachea midline, no masses. Cardiovascular: No clubbing, cyanosis, or edema. Respiratory: Normal respiratory effort, no increased work of breathing. GI: Abdomen is soft, nontender, nondistended, no abdominal masses GU: No CVA tenderness. DRE 45gm smooth.  Skin: No rashes, bruises or suspicious lesions. Lymph: No cervical or inguinal  adenopathy. Neurologic: Grossly intact, no focal deficits, moving all 4 extremities. Psychiatric: Normal mood and affect.  Laboratory Data: Lab Results  Component Value Date   WBC 7.6 02/09/2013   HGB 16.7 02/09/2013   HCT 48.6 02/09/2013   MCV 85.1 02/09/2013   PLT 198.0 02/09/2013    Lab Results  Component Value Date   CREATININE 1.0 02/09/2013    Lab Results  Component Value Date   PSA 0.99 02/09/2013    No results found for: TESTOSTERONE  No results found for: HGBA1C  Urinalysis No results found for: COLORURINE, APPEARANCEUR, LABSPEC, PHURINE, GLUCOSEU, HGBUR, BILIRUBINUR, KETONESUR, PROTEINUR, UROBILINOGEN, NITRITE, LEUKOCYTESUR  Pertinent Imaging: none  Assessment & Plan:    1. Hypogonadism in male  - reinforced risks (increased benign and malignant prostate grosth, increased CV disease, increased ALL CAUSE MORTALITY) and potential benefits (percived improvement in mood, fatigue, libido (NOT erections)) and he opts for continue therapy, preferably with testopel if it is covered by insurance.  . Rec T, E2, CMP, Hgb, PRL, LH, FSH, PSA today for baseline and surveillance. Also frankly discussed he will need Q51mo lab / exam follow up or no refills will be provided. He undertands this and has been very compliant in past.    2. Erectile dysfunction due to arterial insufficiency - frankly discussed this is due to vascular disease and suggested heart healthy diet and lifestyle to mitigate progression.   3. Prostate cancer screening - DRE today normal, again, will require at least Q59mo screening going forward if he is to continue high risk medication.   RTC for testopel after prior-authorization.   Alexis Frock, [facility] [address] [place], [place] [address] [phone]

## 2016-03-04 NOTE — Addendum Note (Signed)
Addended by: Tommy Rainwater on: 03/04/2016 04:22 PM   Modules accepted: Orders

## 2016-03-05 LAB — URINALYSIS, COMPLETE
BILIRUBIN UA: NEGATIVE
GLUCOSE, UA: NEGATIVE
KETONES UA: NEGATIVE
LEUKOCYTES UA: NEGATIVE
Nitrite, UA: NEGATIVE
PH UA: 5.5 (ref 5.0–7.5)
PROTEIN UA: NEGATIVE
RBC UA: NEGATIVE
SPEC GRAV UA: 1.02 (ref 1.005–1.030)
UUROB: 0.2 mg/dL (ref 0.2–1.0)

## 2016-03-05 LAB — PROLACTIN: PROLACTIN: 14.5 ng/mL (ref 4.0–15.2)

## 2016-03-05 LAB — TESTOSTERONE: Testosterone: 305 ng/dL (ref 264–916)

## 2016-03-05 LAB — HEMOGLOBIN AND HEMATOCRIT, BLOOD
Hematocrit: 42 % (ref 37.5–51.0)
Hemoglobin: 14.7 g/dL (ref 13.0–17.7)

## 2016-03-05 LAB — FSH/LH
FSH: 15.8 m[IU]/mL — ABNORMAL HIGH (ref 1.5–12.4)
LH: 8.9 m[IU]/mL — ABNORMAL HIGH (ref 1.7–8.6)

## 2016-03-05 LAB — ESTRADIOL: ESTRADIOL: 13.1 pg/mL (ref 7.6–42.6)

## 2016-03-05 LAB — PSA: Prostate Specific Ag, Serum: 1.3 ng/mL (ref 0.0–4.0)

## 2016-03-05 LAB — MICROSCOPIC EXAMINATION
BACTERIA UA: NONE SEEN
WBC UA: NONE SEEN /HPF (ref 0–?)

## 2016-03-15 ENCOUNTER — Telehealth: Payer: Self-pay | Admitting: *Deleted

## 2016-03-15 DIAGNOSIS — E291 Testicular hypofunction: Secondary | ICD-10-CM

## 2016-03-15 NOTE — Telephone Encounter (Signed)
Spoke with patient about [name] approval. Insurance needs two morning testosterone levels and a medical necessity letter. [name] stated she would write the letter and I scheduled patient lab appointments. Will call patient back when I have a definite approval and make him an appointment. Patient ok with the plan. Orders placed.

## 2016-03-20 ENCOUNTER — Other Ambulatory Visit: Payer: Federal, State, Local not specified - PPO

## 2016-03-20 DIAGNOSIS — E291 Testicular hypofunction: Secondary | ICD-10-CM | POA: Diagnosis not present

## 2016-03-21 ENCOUNTER — Telehealth: Payer: Self-pay

## 2016-03-21 LAB — TESTOSTERONE: TESTOSTERONE: 366 ng/dL (ref 264–916)

## 2016-03-21 NOTE — Telephone Encounter (Signed)
Still waiting for next testosterone labs before Randy Dillon can submit testopel information.

## 2016-03-21 NOTE — Telephone Encounter (Signed)
-----   Message from Nori Riis, PA-C sent at 03/21/2016  8:23 AM EST ----- Patient's testosterones have returned within normal levels.  I am not sure if his insurance has approved the [name].  Will you check with Ramona?

## 2016-03-22 ENCOUNTER — Telehealth: Payer: Self-pay | Admitting: *Deleted

## 2016-03-22 NOTE — Telephone Encounter (Signed)
error 

## 2016-03-22 NOTE — Telephone Encounter (Signed)
Spoke with Patient and let him know that his Testosterone values were in normal limits and Shannon states the [name] will likely be denied because of that and does patient want to try something else gel..etc. Patient states has tried gels before and they were ineffective for him. States he is willing to try the patches or nose spray whichever would be most beneficial. I let him know that I would let [name] know and someone would give him a call early next week. Patient ok with the plan.

## 2016-03-28 ENCOUNTER — Other Ambulatory Visit: Payer: Federal, State, Local not specified - PPO

## 2016-03-29 NOTE — Telephone Encounter (Signed)
What I meant to say is that since his testosterone levels have been within normal limits with [place], we will not be able to prescribe any testosterone products from our office.  If he is able to acquire his testosterones prior to starting therapy from Dr. Bjorn Loser office, that would be great.  Otherwise, we will need to start at the beginning.  He will need to discontinue any testosterone therapy at this time and we will recheck his levels.

## 2016-04-03 NOTE — Telephone Encounter (Signed)
Spoke with patient and cleared up the confusion. Patient states he will get copies of testosterone values and correspondences from Dr. Bjorn Loser office and will get them to me so I can try to proceed with the [name]. Patient ok with the plan and I let him know I would be waiting for him to let me know. Patient states he will.

## 2016-07-19 ENCOUNTER — Encounter: Payer: Self-pay | Admitting: Internal Medicine

## 2016-07-19 ENCOUNTER — Ambulatory Visit (INDEPENDENT_AMBULATORY_CARE_PROVIDER_SITE_OTHER): Payer: Federal, State, Local not specified - PPO | Admitting: Internal Medicine

## 2016-07-19 ENCOUNTER — Ambulatory Visit
Admission: RE | Admit: 2016-07-19 | Discharge: 2016-07-19 | Disposition: A | Discharge status: Home / Self Care | Payer: Federal, State, Local not specified - PPO | Source: Ambulatory Visit | Attending: Internal Medicine | Admitting: Internal Medicine

## 2016-07-19 ENCOUNTER — Ambulatory Visit (INDEPENDENT_AMBULATORY_CARE_PROVIDER_SITE_OTHER)
Admission: RE | Admit: 2016-07-19 | Discharge: 2016-07-19 | Disposition: A | Discharge status: Home / Self Care | Payer: Federal, State, Local not specified - PPO | Source: Ambulatory Visit | Attending: Internal Medicine | Admitting: Internal Medicine

## 2016-07-19 VITALS — BP 122/84 | HR 62 | Temp 98.0°F | Ht 72.5 in | Wt 213.0 lb

## 2016-07-19 DIAGNOSIS — Z0000 Encounter for general adult medical examination without abnormal findings: Secondary | ICD-10-CM | POA: Diagnosis not present

## 2016-07-19 DIAGNOSIS — M79642 Pain in left hand: Secondary | ICD-10-CM

## 2016-07-19 DIAGNOSIS — M19041 Primary osteoarthritis, right hand: Secondary | ICD-10-CM | POA: Diagnosis not present

## 2016-07-19 DIAGNOSIS — L409 Psoriasis, unspecified: Secondary | ICD-10-CM

## 2016-07-19 DIAGNOSIS — M79641 Pain in right hand: Secondary | ICD-10-CM

## 2016-07-19 DIAGNOSIS — M19042 Primary osteoarthritis, left hand: Secondary | ICD-10-CM | POA: Diagnosis not present

## 2016-07-19 DIAGNOSIS — I10 Essential (primary) hypertension: Secondary | ICD-10-CM

## 2016-07-19 LAB — COMPREHENSIVE METABOLIC PANEL
ALBUMIN: 4.5 g/dL (ref 3.5–5.2)
ALK PHOS: 68 U/L (ref 39–117)
ALT: 32 U/L (ref 0–53)
AST: 24 U/L (ref 0–37)
BILIRUBIN TOTAL: 0.5 mg/dL (ref 0.2–1.2)
BUN: 11 mg/dL (ref 6–23)
CO2: 29 mEq/L (ref 19–32)
CREATININE: 0.95 mg/dL (ref 0.40–1.50)
Calcium: 10.1 mg/dL (ref 8.4–10.5)
Chloride: 103 mEq/L (ref 96–112)
GFR: 87.79 mL/min (ref >60.00)
Glucose, Bld: 94 mg/dL (ref 70–99)
Potassium: 4.5 mEq/L (ref 3.5–5.1)
SODIUM: 139 meq/L (ref 135–145)
TOTAL PROTEIN: 7.4 g/dL (ref 6.0–8.3)

## 2016-07-19 LAB — LIPID PANEL
CHOLESTEROL: 215 mg/dL — AB (ref 0–200)
HDL: 42.5 mg/dL (ref >39.00)
LDL Cholesterol: 138 mg/dL — ABNORMAL HIGH (ref 0–99)
NonHDL: 172.77
Total CHOL/HDL Ratio: 5
Triglycerides: 176 mg/dL — ABNORMAL HIGH (ref 0.0–149.0)
VLDL: 35.2 mg/dL (ref 0.0–40.0)

## 2016-07-19 LAB — CBC WITH DIFFERENTIAL/PLATELET
Basophils Absolute: 0 10*3/uL (ref 0.0–0.1)
Basophils Relative: 0.2 % (ref 0.0–3.0)
EOS ABS: 0.1 10*3/uL (ref 0.0–0.7)
Eosinophils Relative: 1.9 % (ref 0.0–5.0)
HCT: 44 % (ref 39.0–52.0)
HEMOGLOBIN: 15.5 g/dL (ref 13.0–17.0)
Lymphocytes Relative: 30.5 % (ref 12.0–46.0)
Lymphs Abs: 1.7 10*3/uL (ref 0.7–4.0)
MCHC: 35.2 g/dL (ref 30.0–36.0)
MCV: 85.2 fl (ref 78.0–100.0)
MONO ABS: 0.7 10*3/uL (ref 0.1–1.0)
Monocytes Relative: 12.1 % — ABNORMAL HIGH (ref 3.0–12.0)
NEUTROS PCT: 55.3 % (ref 43.0–77.0)
Neutro Abs: 3.2 10*3/uL (ref 1.4–7.7)
Platelets: 190 10*3/uL (ref 150.0–400.0)
RBC: 5.16 Mil/uL (ref 4.22–5.81)
RDW: 13.4 % (ref 11.5–15.5)
WBC: 5.7 10*3/uL (ref 4.0–10.5)

## 2016-07-19 MED ORDER — TRIAMCINOLONE ACETONIDE 0.1 % EX CREA: 1.0000 "application " | TOPICAL_CREAM | Freq: Two times a day (BID) | CUTANEOUS | 5 refills | Status: DC | PRN

## 2016-07-19 NOTE — Assessment & Plan Note (Signed)
Very mild Discussed moisturizers and TAC

## 2016-07-19 NOTE — Assessment & Plan Note (Signed)
BP Readings from Last 3 Encounters:  07/19/16 122/84  03/04/16 127/84  10/05/14 132/78   Off meds for some years

## 2016-07-19 NOTE — Progress Notes (Signed)
Subjective:    Patient ID: Randy Dillon, male    DOB: Mar 04, 1960, 56 y.o.   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 gabapentin (NEURONTIN) 600 MG tablet Take 600 mg by mouth 3 (three) times daily.     No current facility-administered medications on file prior to visit.     No Known Allergies  Past Medical History:  Diagnosis Date  . Allergy   . Hyperlipidemia   . Hypertension   . Testosterone deficiency    Dr Jacqlyn Larsen treated in past    Past Surgical History:  Procedure Laterality Date  . CYSTECTOMY  03/2014   cyst removed from lower lumbar  . VASECTOMY Bilateral 2011    Family History  Problem Relation Age of Onset  . Skin cancer Mother   . Heart disease Father   . Diabetes Father     Social History   Social History  . Marital status: Married    Spouse name: N/A  . Number of children: 2  . Years of education: N/A   Occupational History  . postal service    Social History Main Topics  . Smoking status: Never Smoker  . Smokeless tobacco: Never Used  . Alcohol use 1.2 oz/week    2 Standard drinks or equivalent per week  . Drug use: No  . Sexual activity: Not on file   Other Topics Concern  . Not on file   Social History Narrative   Divorced   Remarried 2012   Review of Systems  Constitutional: Negative for  fatigue.       Has gained some weight Wears seat belt  HENT: Positive for hearing loss and tinnitus. Negative for dental problem.        Keeps up with dentist  Eyes: Negative for visual disturbance.       No diplopia or unilateral vision loss  Respiratory: Negative for cough, chest tightness and shortness of breath.   Cardiovascular: Negative for chest pain, palpitations and leg swelling.  Gastrointestinal: Negative for abdominal pain, blood in stool, constipation and nausea.       Occ indigestion --tums will help  Endocrine: Negative for polydipsia and polyuria.  Genitourinary: Negative for difficulty urinating and urgency.  Musculoskeletal: Positive for arthralgias. Negative for back pain and joint swelling.       Cyst removed from back 2 years ago-- back pain better since then  Skin: Positive for rash.  Allergic/Immunologic: Positive for environmental allergies. Negative for immunocompromised state.  Neurological: Negative for dizziness, syncope and light-headedness.       Rare headache  Hematological: Negative for adenopathy. Does not bruise/bleed easily.  Psychiatric/Behavioral: Negative for dysphoric mood and sleep disturbance.       Occ brief anxiety       Objective:   Physical Exam  Constitutional: He is  oriented to person, place, and time. He appears well-nourished. No distress.  HENT:  Head: Normocephalic and atraumatic.  Right Ear: External ear normal.  Left Ear: External ear normal.  Mouth/Throat: Oropharynx is clear and moist. No oropharyngeal exudate.  Eyes: Conjunctivae are normal. Pupils are equal, round, and reactive to light.  Neck: No thyromegaly present.  Cardiovascular: Normal rate, regular rhythm, normal heart sounds and intact distal pulses.  Exam reveals no gallop.   No murmur heard. Pulmonary/Chest: Effort normal and breath sounds normal. No respiratory distress. He has no wheezes. He has no rales.  Abdominal: Soft. He exhibits no distension. There is no  tenderness. There is no rebound and no guarding.  Musculoskeletal: He exhibits no edema or tenderness.  No active synovitis  Lymphadenopathy:    He has no cervical adenopathy.  Neurological: He is alert and oriented to person, place, and time.  Skin:  Mild psoriatic type rash on elbows and extensor fingers  Psychiatric: He has a normal mood and affect. His behavior is normal.          Assessment & Plan:

## 2016-07-19 NOTE — Assessment & Plan Note (Signed)
Likely early OA Gabapentin helps Will check x-rays

## 2016-07-19 NOTE — Assessment & Plan Note (Signed)
Healthy but needs to work on fitness Colon due 2025 Just had PSA Yearly flu vaccine

## 2017-02-11 DIAGNOSIS — L400 Psoriasis vulgaris: Secondary | ICD-10-CM | POA: Diagnosis not present

## 2017-02-11 DIAGNOSIS — L219 Seborrheic dermatitis, unspecified: Secondary | ICD-10-CM | POA: Diagnosis not present

## 2017-02-11 DIAGNOSIS — L739 Follicular disorder, unspecified: Secondary | ICD-10-CM | POA: Diagnosis not present

## 2017-02-11 DIAGNOSIS — D1800 Hemangioma unspecified site: Secondary | ICD-10-CM | POA: Diagnosis not present

## 2017-03-17 DIAGNOSIS — L409 Psoriasis, unspecified: Secondary | ICD-10-CM | POA: Diagnosis not present

## 2017-03-17 DIAGNOSIS — M1812 Unilateral primary osteoarthritis of first carpometacarpal joint, left hand: Secondary | ICD-10-CM | POA: Diagnosis not present

## 2017-04-02 DIAGNOSIS — L400 Psoriasis vulgaris: Secondary | ICD-10-CM | POA: Diagnosis not present

## 2017-04-02 DIAGNOSIS — L219 Seborrheic dermatitis, unspecified: Secondary | ICD-10-CM | POA: Diagnosis not present

## 2017-07-03 DIAGNOSIS — L219 Seborrheic dermatitis, unspecified: Secondary | ICD-10-CM | POA: Diagnosis not present

## 2017-07-03 DIAGNOSIS — L400 Psoriasis vulgaris: Secondary | ICD-10-CM | POA: Diagnosis not present

## 2017-07-29 ENCOUNTER — Encounter: Payer: Self-pay | Admitting: Internal Medicine

## 2017-07-29 ENCOUNTER — Ambulatory Visit (INDEPENDENT_AMBULATORY_CARE_PROVIDER_SITE_OTHER): Payer: Federal, State, Local not specified - PPO | Admitting: Internal Medicine

## 2017-07-29 VITALS — BP 122/84 | HR 79 | Temp 97.5°F | Ht 72.5 in | Wt 211.0 lb

## 2017-07-29 DIAGNOSIS — L409 Psoriasis, unspecified: Secondary | ICD-10-CM | POA: Diagnosis not present

## 2017-07-29 DIAGNOSIS — M1812 Unilateral primary osteoarthritis of first carpometacarpal joint, left hand: Secondary | ICD-10-CM | POA: Diagnosis not present

## 2017-07-29 DIAGNOSIS — I10 Essential (primary) hypertension: Secondary | ICD-10-CM | POA: Diagnosis not present

## 2017-07-29 DIAGNOSIS — Z0000 Encounter for general adult medical examination without abnormal findings: Secondary | ICD-10-CM | POA: Diagnosis not present

## 2017-07-29 LAB — CBC
HCT: 45.9 % (ref 39.0–52.0)
HEMOGLOBIN: 16.1 g/dL (ref 13.0–17.0)
MCHC: 35 g/dL (ref 30.0–36.0)
MCV: 85.8 fl (ref 78.0–100.0)
PLATELETS: 198 10*3/uL (ref 150.0–400.0)
RBC: 5.35 Mil/uL (ref 4.22–5.81)
RDW: 13.4 % (ref 11.5–15.5)
WBC: 6.8 10*3/uL (ref 4.0–10.5)

## 2017-07-29 LAB — COMPREHENSIVE METABOLIC PANEL
ALK PHOS: 59 U/L (ref 39–117)
ALT: 33 U/L (ref 0–53)
AST: 24 U/L (ref 0–37)
Albumin: 4.4 g/dL (ref 3.5–5.2)
BUN: 13 mg/dL (ref 6–23)
CO2: 31 mEq/L (ref 19–32)
Calcium: 10 mg/dL (ref 8.4–10.5)
Chloride: 102 mEq/L (ref 96–112)
Creatinine, Ser: 0.97 mg/dL (ref 0.40–1.50)
GFR: 85.38 mL/min (ref >60.00)
GLUCOSE: 101 mg/dL — AB (ref 70–99)
POTASSIUM: 4.5 meq/L (ref 3.5–5.1)
Sodium: 139 mEq/L (ref 135–145)
TOTAL PROTEIN: 7.7 g/dL (ref 6.0–8.3)
Total Bilirubin: 0.7 mg/dL (ref 0.2–1.2)

## 2017-07-29 NOTE — Assessment & Plan Note (Signed)
Healthy  Needs to get back to exercise Yearly flu vaccine Colon next year (+FH) Defer PSA

## 2017-07-29 NOTE — Assessment & Plan Note (Signed)
BP Readings from Last 3 Encounters:  07/29/17 122/84  07/19/16 122/84  03/04/16 127/84   Still fine without meds

## 2017-07-29 NOTE — Progress Notes (Signed)
Subjective:    Patient ID: Randy Dillon, male    DOB: 05-04-1960, 57 y.o.   MRN: [id number]  HPI Here for physical  Has started otezla for the past month Prescribed by Dr Lorin Picket Dr Bock---didn't think he had psoriatic arthritis Seems to be working  Energy levels not great Hasn't been exercising Does have some nausea for about an hour after some of the doses  Some trouble sleeping--can't initiate Not depressed or preoccupied  Not seeing urologist Not really having issues with ED---single and no partner right now Sildenafil did help in past  Current Outpatient Medications on File Prior to Visit  Medication Sig Dispense Refill  . augmented betamethasone dipropionate (DIPROLENE-AF) 0.05 % ointment 1 APPLICATION APPLY ON THE SKIN TWICE A DAY AVOID FACE/GROIN/AXILLARY    . cetirizine (ZYRTEC) 10 MG tablet Take 10 mg by mouth daily.    Marland Kitchen OTEZLA 30 MG TABS Take 2 tablets by mouth daily.    Marland Kitchen triamcinolone cream (KENALOG) 0.1 % Apply 1 application topically 2 (two) times daily as needed. 45 g 5   No current facility-administered medications on file prior to visit.     No Known Allergies  Past Medical History:  Diagnosis Date  . Allergy   . Hyperlipidemia   . Hypertension   . Testosterone deficiency    Dr Jacqlyn Larsen treated in past    Past Surgical History:  Procedure Laterality Date  . CYSTECTOMY  03/2014   cyst removed from lower lumbar  . VASECTOMY Bilateral 2011    Family History  Problem Relation Age of Onset  . Skin cancer Mother   . Heart disease Father   . Diabetes Father     Social History   Socioeconomic History  . Marital status: Married    Spouse name: Not on file  . Number of children: 2  . Years of education: Not on file  . Highest education level: Not on file  Occupational History  . Occupation: Actor  Social Needs  . Financial resource strain: Not on file  . Food insecurity:    Worry: Not on file    Inability: Not on file  .  Transportation needs:    Medical: Not on file    Non-medical: Not on file  Tobacco Use  . Smoking status: Never Smoker  . Smokeless tobacco: Never Used  Substance and Sexual Activity  . Alcohol use: Yes    Alcohol/week: 1.2 oz    Types: 2 Standard drinks or equivalent per week  . Drug use: No  . Sexual activity: Not on file  Lifestyle  . Physical activity:    Days per week: Not on file    Minutes per session: Not on file  . Stress: Not on file  Relationships  . Social connections:    Talks on phone: Not on file    Gets together: Not on file    Attends religious service: Not on file    Active member of club or organization: Not on file    Attends meetings of clubs or organizations: Not on file    Relationship status: Not on file  . Intimate partner violence:    Fear of current or ex partner: Not on file    Emotionally abused: Not on file    Physically abused: Not on file    Forced sexual activity: Not on file  Other Topics Concern  . Not on file  Social History Narrative   Divorced   Remarried 2012  Review of Systems  Constitutional: Positive for fatigue. Negative for unexpected weight change.       Wears seat belt  HENT: Positive for hearing loss and tinnitus. Negative for dental problem and trouble swallowing.        Keeps up with dentist  Eyes: Negative for visual disturbance.       No diplopia or unilateral vision loss  Respiratory: Positive for wheezing. Negative for chest tightness and shortness of breath.        Some nagging cough---?allergies  Cardiovascular: Negative for chest pain, palpitations and leg swelling.  Gastrointestinal: Negative for blood in stool and constipation.       Occ heartburn--- uses tums prn  Endocrine: Negative for polydipsia and polyuria.  Genitourinary: Negative for difficulty urinating and urgency.  Musculoskeletal: Negative for joint swelling.       Chronic back and hand pain---did get cortisone shot in past in left thumb  [employer]) Ibuprofen prn  Skin:       No suspicious lesions  Allergic/Immunologic: Positive for environmental allergies. Negative for immunocompromised state.       Stays on medications mostly  Neurological: Negative for dizziness, syncope, light-headedness and headaches.  Hematological: Negative for adenopathy. Does not bruise/bleed easily.  Psychiatric/Behavioral: Negative for dysphoric mood. The patient is not nervous/anxious.        Objective:   Physical Exam  Constitutional: He is oriented to person, place, and time. He appears well-developed. No distress.  HENT:  Head: Normocephalic and atraumatic.  Right Ear: External ear normal.  Left Ear: External ear normal.  Mouth/Throat: Oropharynx is clear and moist. No oropharyngeal exudate.  Eyes: Pupils are equal, round, and reactive to light. Conjunctivae are normal.  Neck: No thyromegaly present.  Cardiovascular: Normal rate, regular rhythm, normal heart sounds and intact distal pulses. Exam reveals no gallop.  No murmur heard. Respiratory: Effort normal and breath sounds normal. No respiratory distress. He has no wheezes. He has no rales.  GI: Soft. There is no tenderness.  Musculoskeletal: He exhibits no edema or tenderness.  Lymphadenopathy:    He has no cervical adenopathy.  Neurological: He is alert and oriented to person, place, and time.  Skin: No rash noted. No erythema.  Psychiatric: He has a normal mood and affect. His behavior is normal.           Assessment & Plan:

## 2017-07-29 NOTE — Assessment & Plan Note (Signed)
Rash resolved on otezla Will check labs

## 2017-09-17 DIAGNOSIS — L400 Psoriasis vulgaris: Secondary | ICD-10-CM | POA: Diagnosis not present

## 2017-12-31 DIAGNOSIS — L400 Psoriasis vulgaris: Secondary | ICD-10-CM | POA: Diagnosis not present

## 2018-01-29 DIAGNOSIS — L409 Psoriasis, unspecified: Secondary | ICD-10-CM | POA: Diagnosis not present

## 2018-01-29 DIAGNOSIS — M1812 Unilateral primary osteoarthritis of first carpometacarpal joint, left hand: Secondary | ICD-10-CM | POA: Diagnosis not present

## 2018-06-30 DIAGNOSIS — I10 Essential (primary) hypertension: Secondary | ICD-10-CM | POA: Diagnosis not present

## 2018-06-30 DIAGNOSIS — M1812 Unilateral primary osteoarthritis of first carpometacarpal joint, left hand: Secondary | ICD-10-CM | POA: Diagnosis not present

## 2018-06-30 DIAGNOSIS — L409 Psoriasis, unspecified: Secondary | ICD-10-CM | POA: Diagnosis not present

## 2018-06-30 DIAGNOSIS — Z800 Family history of malignant neoplasm of digestive organs: Secondary | ICD-10-CM | POA: Diagnosis not present

## 2018-07-08 DIAGNOSIS — L400 Psoriasis vulgaris: Secondary | ICD-10-CM | POA: Diagnosis not present

## 2018-07-13 DIAGNOSIS — Z1159 Encounter for screening for other viral diseases: Secondary | ICD-10-CM | POA: Diagnosis not present

## 2018-07-18 ENCOUNTER — Encounter: Payer: Self-pay | Admitting: *Deleted

## 2018-07-18 DIAGNOSIS — K635 Polyp of colon: Secondary | ICD-10-CM | POA: Diagnosis not present

## 2018-07-18 DIAGNOSIS — D125 Benign neoplasm of sigmoid colon: Secondary | ICD-10-CM | POA: Diagnosis not present

## 2018-07-18 DIAGNOSIS — K640 First degree hemorrhoids: Secondary | ICD-10-CM | POA: Diagnosis not present

## 2018-07-18 DIAGNOSIS — D120 Benign neoplasm of cecum: Secondary | ICD-10-CM | POA: Diagnosis not present

## 2018-07-18 DIAGNOSIS — Z1211 Encounter for screening for malignant neoplasm of colon: Secondary | ICD-10-CM | POA: Diagnosis not present

## 2018-07-18 DIAGNOSIS — Z800 Family history of malignant neoplasm of digestive organs: Secondary | ICD-10-CM | POA: Diagnosis not present

## 2018-07-18 LAB — HM COLONOSCOPY

## 2018-07-20 DIAGNOSIS — M1812 Unilateral primary osteoarthritis of first carpometacarpal joint, left hand: Secondary | ICD-10-CM | POA: Diagnosis not present

## 2018-07-20 DIAGNOSIS — L409 Psoriasis, unspecified: Secondary | ICD-10-CM | POA: Diagnosis not present

## 2018-07-20 DIAGNOSIS — M79645 Pain in left finger(s): Secondary | ICD-10-CM | POA: Diagnosis not present

## 2018-09-25 ENCOUNTER — Encounter: Payer: Self-pay | Admitting: Internal Medicine

## 2018-11-07 IMAGING — DX DG HAND 2V*R*
2 series · 2 of 2 positions shown · non-contrast
Comparison: 07/19/2016

CLINICAL DATA: joint pain

EXAM:
RIGHT HAND - 2 VIEW

[hand ap]
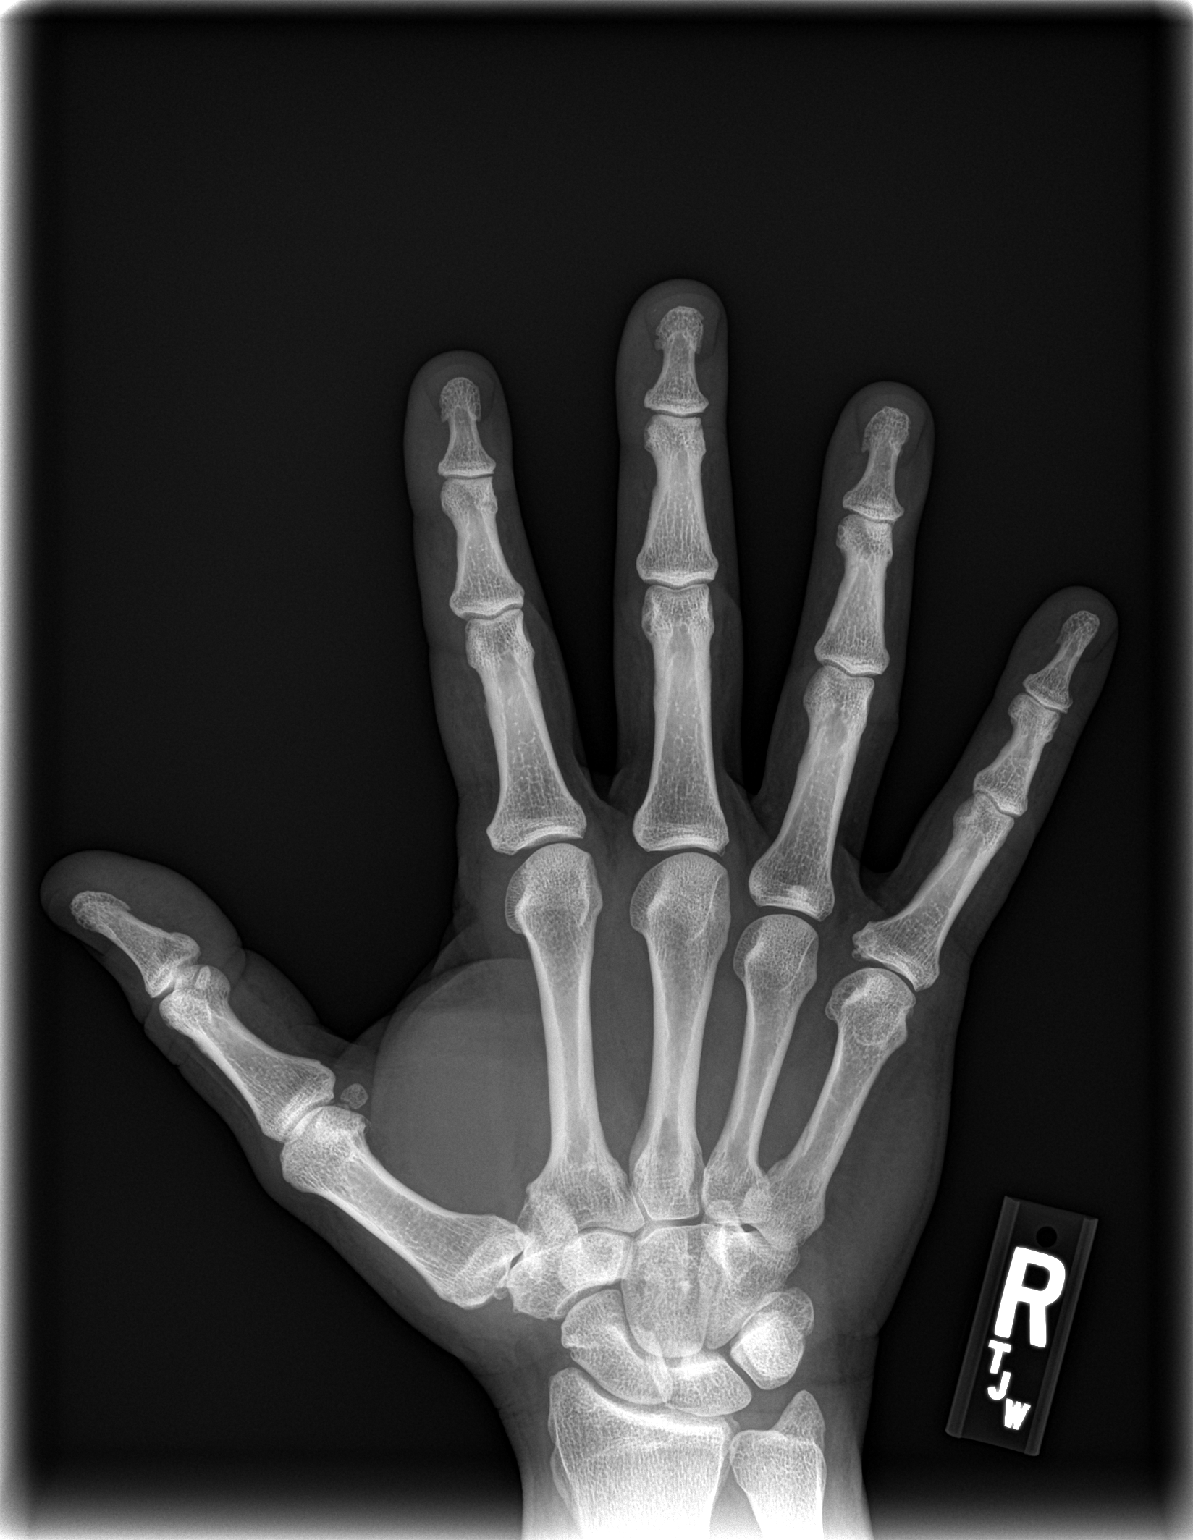

[hand lat]
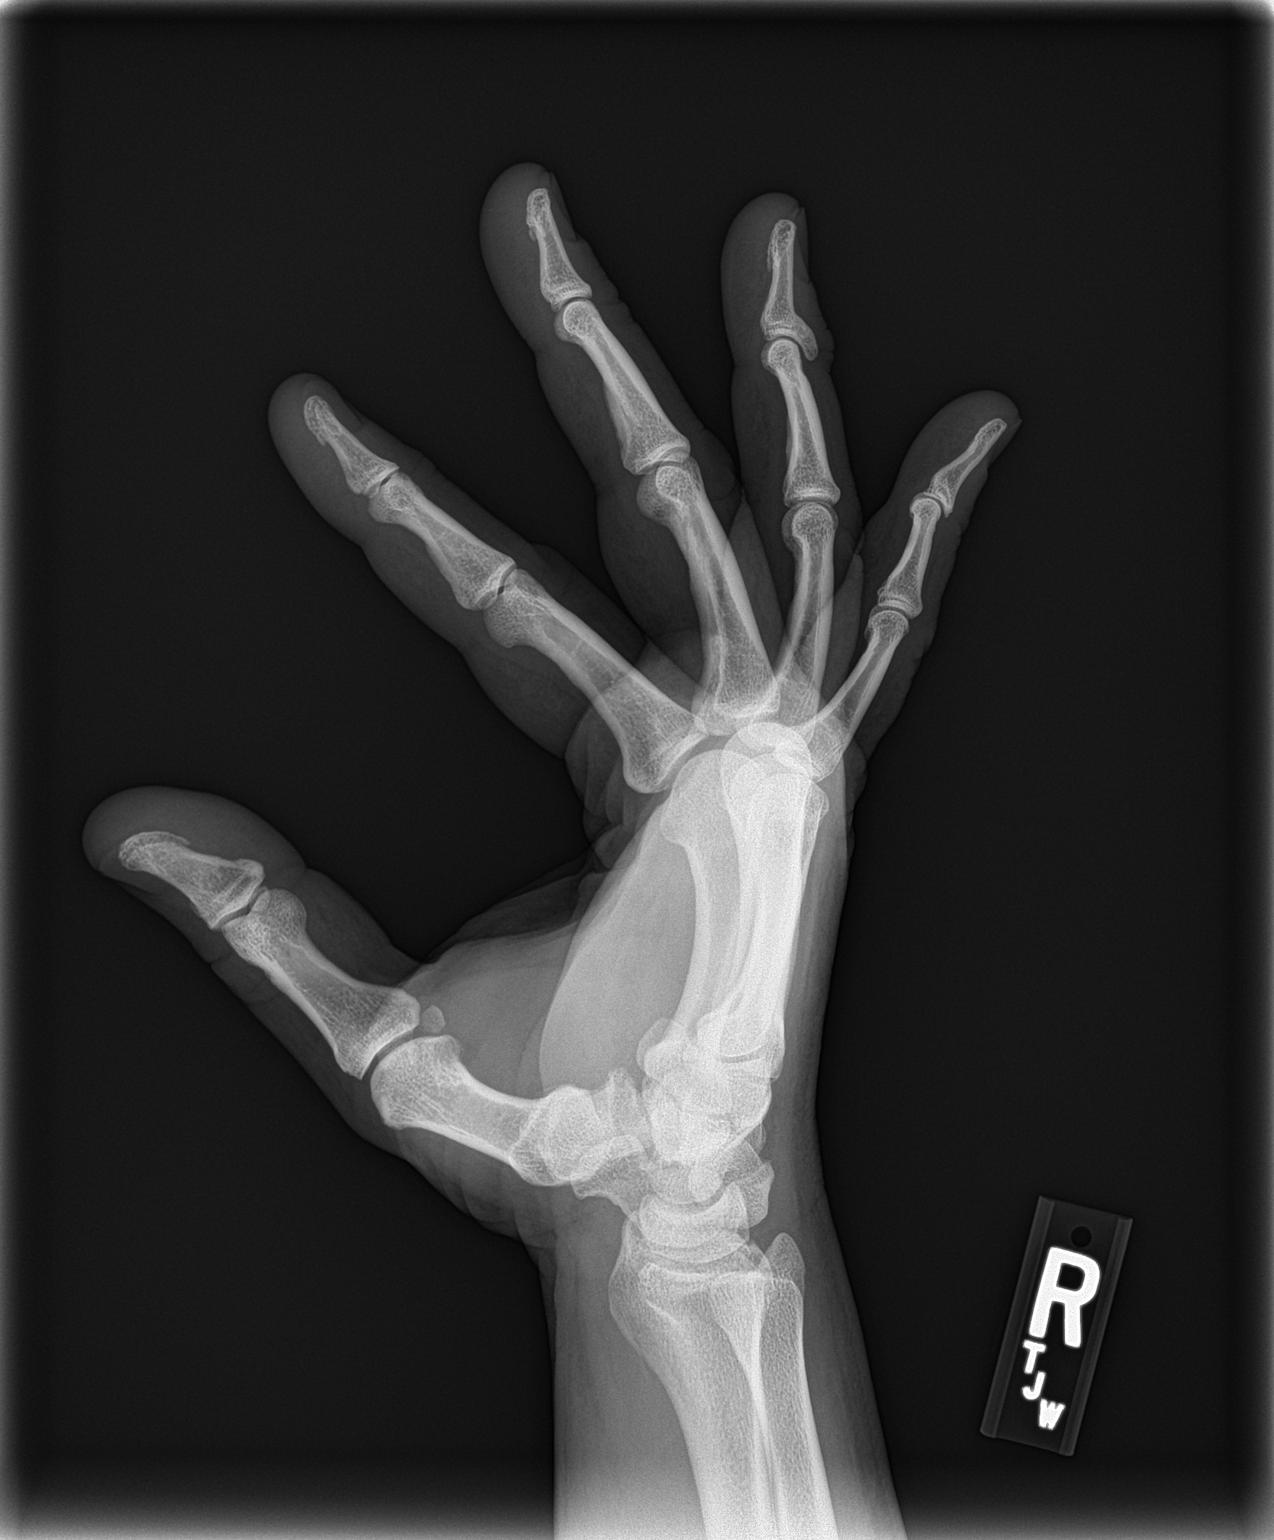

[2 of 2 positions shown; findings below may reference images not displayed]

FINDINGS: There is no evidence of fracture or dislocation. There is no
evidence of arthropathy or other focal bone abnormality. Soft
tissues are unremarkable.
IMPRESSION: No acute osseous finding or significant arthropathy. No erosive
process.

## 2018-11-07 IMAGING — DX DG HAND 2V*L*
2 series · 2 of 2 positions shown · non-contrast
Comparison: 07/19/2016

CLINICAL DATA: joint pain

EXAM:
LEFT HAND - 2 VIEW

[hand ap]
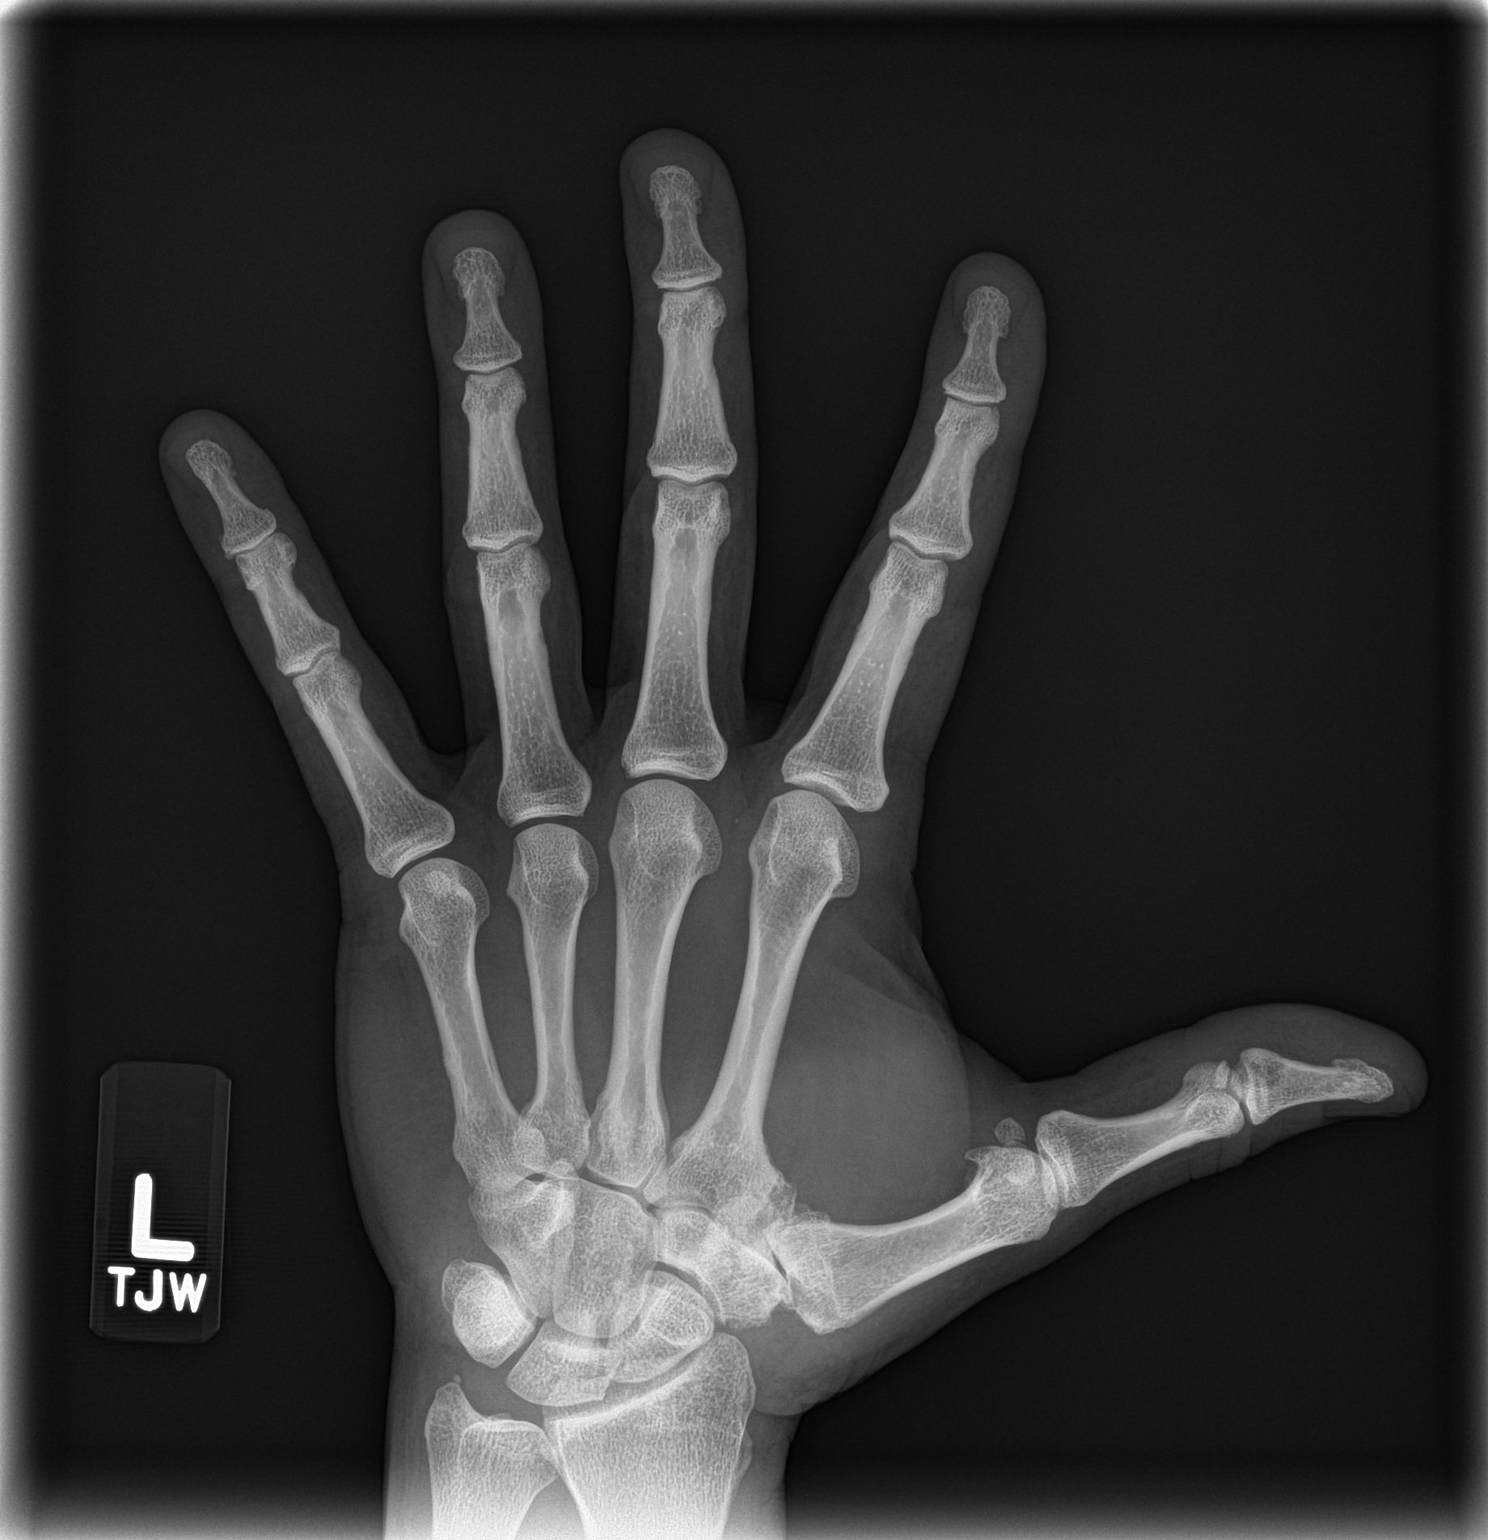

[hand lat]
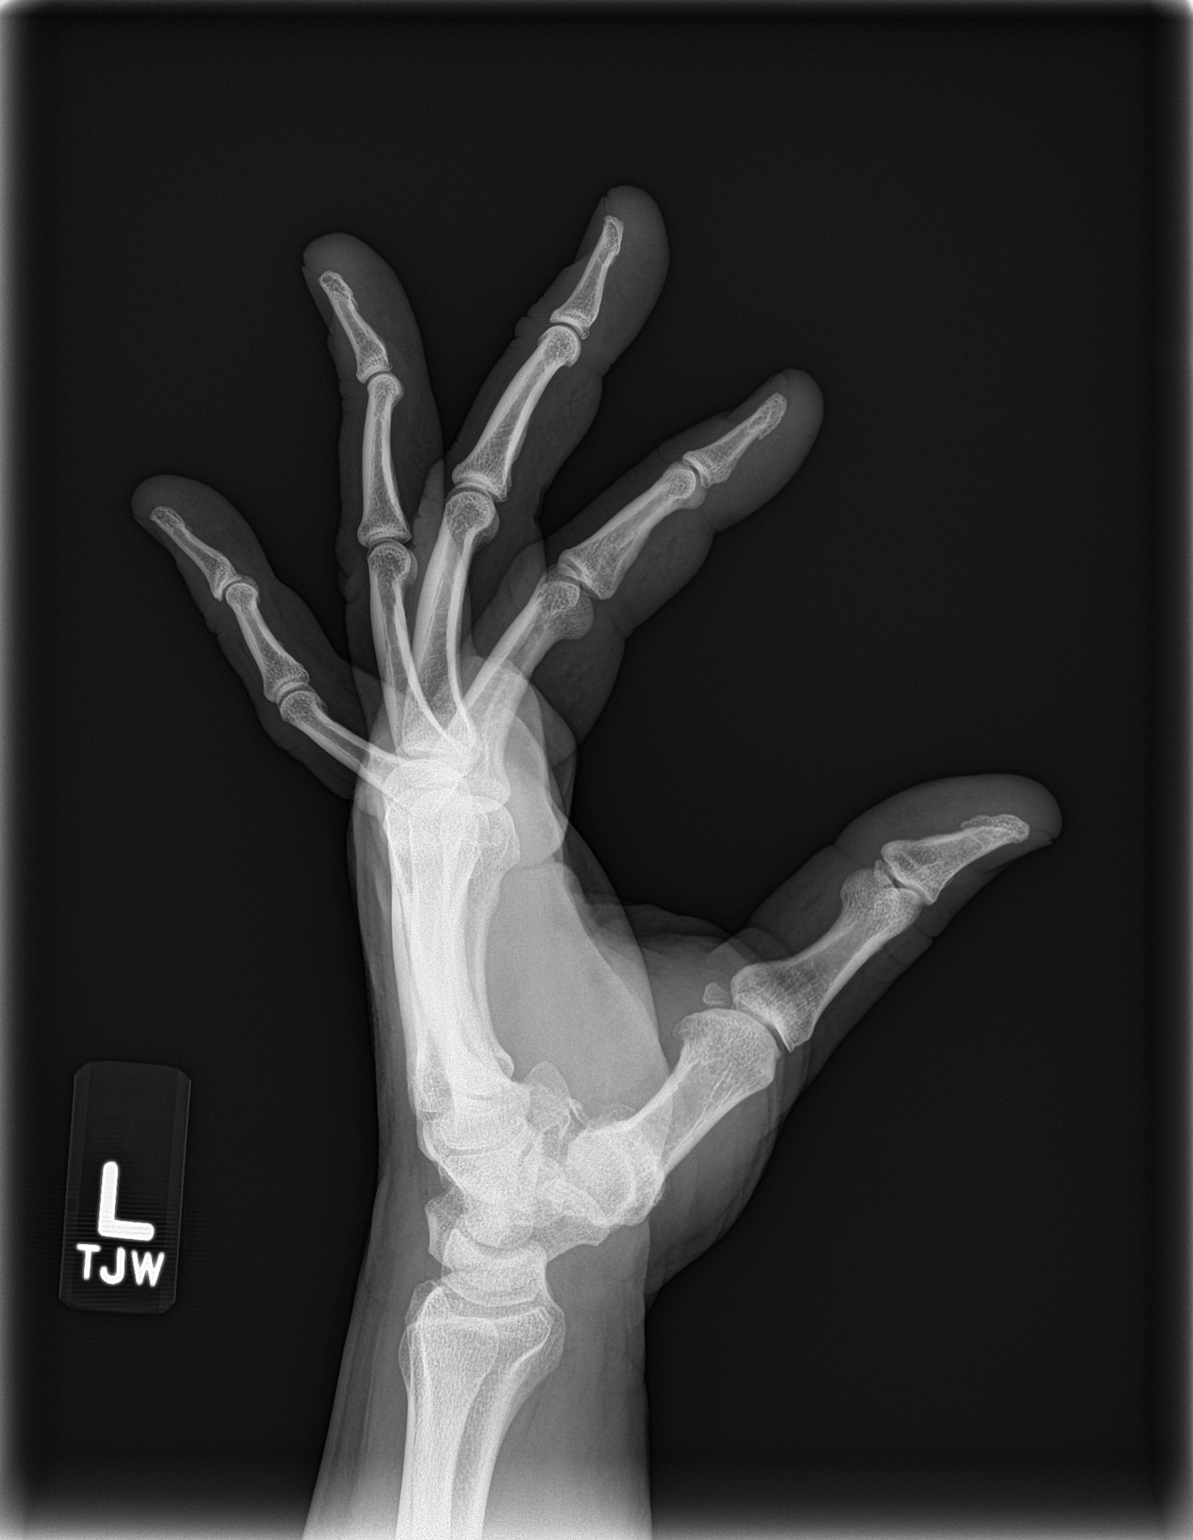

[2 of 2 positions shown; findings below may reference images not displayed]

FINDINGS: Normal alignment without fracture. No erosive process. Mild
degenerative osteoarthritis of the left first CMC joint involving
the trapezium and first metacarpal articulation with joint space
loss, sclerosis and bony spurring. No soft tissue abnormality.
IMPRESSION: Mild degenerative arthritis of the left first CMC joint.

No other acute osseous finding or erosive process.

## 2018-12-31 DIAGNOSIS — L400 Psoriasis vulgaris: Secondary | ICD-10-CM | POA: Diagnosis not present

## 2018-12-31 DIAGNOSIS — L821 Other seborrheic keratosis: Secondary | ICD-10-CM | POA: Diagnosis not present

## 2018-12-31 DIAGNOSIS — I781 Nevus, non-neoplastic: Secondary | ICD-10-CM | POA: Diagnosis not present

## 2019-02-03 DIAGNOSIS — M1812 Unilateral primary osteoarthritis of first carpometacarpal joint, left hand: Secondary | ICD-10-CM | POA: Diagnosis not present

## 2019-02-03 DIAGNOSIS — M79645 Pain in left finger(s): Secondary | ICD-10-CM | POA: Diagnosis not present

## 2019-02-03 DIAGNOSIS — L409 Psoriasis, unspecified: Secondary | ICD-10-CM | POA: Diagnosis not present

## 2019-02-03 DIAGNOSIS — M545 Low back pain: Secondary | ICD-10-CM | POA: Diagnosis not present

## 2019-06-10 ENCOUNTER — Other Ambulatory Visit: Payer: Self-pay

## 2019-07-21 DIAGNOSIS — G8929 Other chronic pain: Secondary | ICD-10-CM | POA: Diagnosis not present

## 2019-07-21 DIAGNOSIS — M545 Low back pain: Secondary | ICD-10-CM | POA: Diagnosis not present

## 2019-07-21 DIAGNOSIS — M1812 Unilateral primary osteoarthritis of first carpometacarpal joint, left hand: Secondary | ICD-10-CM | POA: Diagnosis not present

## 2019-07-21 DIAGNOSIS — M79645 Pain in left finger(s): Secondary | ICD-10-CM | POA: Diagnosis not present

## 2019-07-21 DIAGNOSIS — L409 Psoriasis, unspecified: Secondary | ICD-10-CM | POA: Diagnosis not present

## 2019-08-04 ENCOUNTER — Other Ambulatory Visit: Payer: Self-pay | Admitting: Dermatology

## 2019-08-16 DIAGNOSIS — M545 Low back pain: Secondary | ICD-10-CM | POA: Diagnosis not present

## 2019-08-16 DIAGNOSIS — G8929 Other chronic pain: Secondary | ICD-10-CM | POA: Diagnosis not present

## 2019-08-24 DIAGNOSIS — M545 Low back pain: Secondary | ICD-10-CM | POA: Diagnosis not present

## 2019-08-24 DIAGNOSIS — G8929 Other chronic pain: Secondary | ICD-10-CM | POA: Diagnosis not present

## 2019-09-01 ENCOUNTER — Other Ambulatory Visit: Payer: Self-pay | Admitting: Dermatology

## 2019-09-01 ENCOUNTER — Other Ambulatory Visit: Payer: Self-pay

## 2019-09-01 ENCOUNTER — Ambulatory Visit: Payer: Federal, State, Local not specified - PPO | Admitting: Dermatology

## 2019-09-01 DIAGNOSIS — M545 Low back pain: Secondary | ICD-10-CM | POA: Diagnosis not present

## 2019-09-01 DIAGNOSIS — L409 Psoriasis, unspecified: Secondary | ICD-10-CM | POA: Diagnosis not present

## 2019-09-01 DIAGNOSIS — G8929 Other chronic pain: Secondary | ICD-10-CM | POA: Diagnosis not present

## 2019-09-01 MED ORDER — CALCIPOTRIENE 0.005 % EX CREA: TOPICAL_CREAM | CUTANEOUS | 11 refills | Status: DC

## 2019-09-01 MED ORDER — OTEZLA 30 MG PO TABS: 1.0000 | ORAL_TABLET | Freq: Two times a day (BID) | ORAL | 11 refills | Status: DC

## 2019-09-01 MED ORDER — LEXETTE 0.05 % EX FOAM: CUTANEOUS | 11 refills | Status: DC

## 2019-09-01 NOTE — Patient Instructions (Addendum)
Recommend daily broad spectrum sunscreen SPF 30+ to sun-exposed areas, reapply every 2 hours as needed. Call for new or changing lesions.  Topical steroids (such as triamcinolone, fluocinolone, fluocinonide, mometasone, clobetasol, halobetasol, betamethasone, hydrocortisone) can cause thinning and lightening of the skin if they are used for too long in the same area. Your physician has selected the right strength medicine for your problem and area affected on the body. Please use your medication only as directed by your physician to prevent side effects. Avoid applying to face, groin, and axilla. Use as directed. Risk of skin atrophy with long-term use reviewed.   Side effects of Otezla (apremilast) include diarrhea, nausea, headache, upper respiratory infection, depression, and weight decrease (5-10%). It should only be taken by pregnant women after a discussion regarding risks and benefits with their doctor. .   Gentle Skin Care Guide  1. Bathe no more than once a day.  2. Avoid bathing in hot water  3. Use a mild soap like Dove, Vanicream, Cetaphil, CeraVe. Can use Lever 2000 or Cetaphil antibacterial soap  4. Use soap only where you need it. On most days, use it under your arms, between your legs, and on your feet. Let the water rinse other areas unless visibly dirty.  5. When you get out of the bath/shower, use a towel to gently blot your skin dry, don't rub it.  6. While your skin is still a little damp, apply a moisturizing cream such as Vanicream, CeraVe, Cetaphil, Eucerin, Sarna lotion or plain Vaseline Jelly. For hands apply Neutrogena [place] Hand Cream or Excipial Hand Cream.  7. Reapply moisturizer any time you start to itch or feel dry.  8. Sometimes using free and clear laundry detergents can be helpful. Fabric softener sheets should be avoided. Downy Free & Gentle liquid, or any liquid fabric softener that is free of dyes and perfumes, it acceptable to use  9. If your  doctor has given you prescription creams you may apply moisturizers over them

## 2019-09-01 NOTE — Progress Notes (Signed)
   Follow-Up Visit   Subjective  Randy Dillon is a 59 y.o. male who presents for the following: Follow-up (Psoriasis).  Patient presents today for follow up for Psoriasis, states he is having no side effects at this time. Patient is currently using Otezla,  an OTC moisturizer, TMC cream, halobetasol ointment, betamethasone ointment and Enstilar Patient would like refill of Otezla today  The following portions of the chart were reviewed this encounter and updated as appropriate:  Tobacco  Allergies  Meds  Problems  Med Hx  Surg Hx  Fam Hx      Review of Systems:  No other skin or systemic complaints except as noted in HPI or Assessment and Plan.  Objective  Well appearing patient in no apparent distress; mood and affect are within normal limits.  A focused examination was performed including face, arms, hands, nails, feet, legs, scalp. Relevant physical exam findings are noted in the Assessment and Plan.  Objective  Right Hand - Anterior, Right Knee - Anterior: Scaly pink papule right knee, diffuse scaly pink plaque dorsal hands   Assessment & Plan  Psoriasis (2) Right Hand - Anterior; Right Knee - Anterior  Chronic, not at goal. Denies new joint pain.  Continue Otezla as prescribed. Continue Lexette foam and calcipotriene as prescribed. Can use lexette foam twice daily for two weeks to calm current flare. Avoid applying Lexette to face, groin, and axilla. Use as directed. Risk of skin atrophy with long-term use reviewed.    Recommend using calcipotriene daily in place of OTC moisturizer to help keep psoriasis better controlled  Reordered Medications Apremilast (OTEZLA) 30 MG TABS Halobetasol Propionate (LEXETTE) 0.05 % FOAM calcipotriene (DOVONOX) 0.005 % cream  Return in about 1 year (around 08/31/2020) for psoriasis.   IDonzetta Kohut, CMA, am acting as scribe for Forest Gleason, MD .  Documentation: I have reviewed the above documentation for accuracy and  completeness, and I agree with the above.  Forest Gleason, MD

## 2019-09-02 ENCOUNTER — Ambulatory Visit: Payer: Self-pay | Admitting: Dermatology

## 2019-09-09 DIAGNOSIS — G8929 Other chronic pain: Secondary | ICD-10-CM | POA: Diagnosis not present

## 2019-09-09 DIAGNOSIS — M545 Low back pain: Secondary | ICD-10-CM | POA: Diagnosis not present

## 2019-09-12 ENCOUNTER — Encounter: Payer: Self-pay | Admitting: Dermatology

## 2019-09-17 DIAGNOSIS — M545 Low back pain: Secondary | ICD-10-CM | POA: Diagnosis not present

## 2019-09-17 DIAGNOSIS — G8929 Other chronic pain: Secondary | ICD-10-CM | POA: Diagnosis not present

## 2019-09-29 ENCOUNTER — Other Ambulatory Visit: Payer: Self-pay | Admitting: Dermatology

## 2019-09-29 DIAGNOSIS — L409 Psoriasis, unspecified: Secondary | ICD-10-CM

## 2019-12-21 ENCOUNTER — Other Ambulatory Visit: Payer: Self-pay

## 2019-12-21 DIAGNOSIS — L409 Psoriasis, unspecified: Secondary | ICD-10-CM

## 2019-12-21 MED ORDER — OTEZLA 30 MG PO TABS: 1.0000 | ORAL_TABLET | Freq: Two times a day (BID) | ORAL | 3 refills | Status: DC

## 2019-12-21 NOTE — Progress Notes (Signed)
Switching pharmacy.

## 2020-01-05 DIAGNOSIS — Z791 Long term (current) use of non-steroidal anti-inflammatories (NSAID): Secondary | ICD-10-CM | POA: Diagnosis not present

## 2020-01-05 DIAGNOSIS — M545 Low back pain, unspecified: Secondary | ICD-10-CM | POA: Diagnosis not present

## 2020-01-05 DIAGNOSIS — M79645 Pain in left finger(s): Secondary | ICD-10-CM | POA: Diagnosis not present

## 2020-01-05 DIAGNOSIS — L409 Psoriasis, unspecified: Secondary | ICD-10-CM | POA: Diagnosis not present

## 2020-01-05 DIAGNOSIS — M1812 Unilateral primary osteoarthritis of first carpometacarpal joint, left hand: Secondary | ICD-10-CM | POA: Diagnosis not present

## 2020-02-10 ENCOUNTER — Telehealth: Payer: Self-pay | Admitting: Internal Medicine

## 2020-02-10 NOTE — Telephone Encounter (Signed)
Spoke to pt's wife. She said he could not get off work until next week. Made appt next Wednesday.

## 2020-02-10 NOTE — Telephone Encounter (Signed)
Pt called in wanted to know about getting a referral for a urologist due to he having a problem with his testicle, its tender to the touch and it felt like a peanut.

## 2020-02-10 NOTE — Telephone Encounter (Signed)
It sounds more like an acute issue (like infection) that would be better dealt with here---like add on schedule tomorrow--rather than waiting for a consultation with a urologist. We can do the referral at the visit if that is indicated.

## 2020-02-16 ENCOUNTER — Other Ambulatory Visit: Payer: Self-pay

## 2020-02-16 ENCOUNTER — Encounter: Payer: Self-pay | Admitting: Internal Medicine

## 2020-02-16 ENCOUNTER — Ambulatory Visit: Payer: Federal, State, Local not specified - PPO | Admitting: Internal Medicine

## 2020-02-16 DIAGNOSIS — N4340 Spermatocele of epididymis, unspecified: Secondary | ICD-10-CM | POA: Insufficient documentation

## 2020-02-16 NOTE — Progress Notes (Signed)
Subjective:    Patient ID: Randy Dillon, male    DOB: 25-Jun-1960, 60 y.o.   MRN: [id number]  HPI Here due to a lump on his right testicle This visit occurred during the SARS-CoV-2 public health emergency.  Safety protocols were in place, including screening questions prior to the visit, additional usage of staff PPE, and extensive cleaning of exam room while observing appropriate contact time as indicated for disinfecting solutions.   In shower, felt it "weird down there" Feels like testes is "a peanut shell"--seems to have a double hump Noticed it a week ago May be some better since then No pain---but is sensitive if he moves while sitting (slighty sensitive) Does notice it more in the shower No scrotal swelling  Monogamous with wife Last intercourse ~ 3 weeks ago No problems then  Current Outpatient Medications on File Prior to Visit  Medication Sig Dispense Refill  . Apremilast (OTEZLA) 30 MG TABS Take 1 tablet (30 mg total) by mouth 2 (two) times daily. 60 tablet 3  . augmented betamethasone dipropionate (DIPROLENE-AF) 0.05 % ointment 1 APPLICATION APPLY ON THE SKIN TWICE A DAY AVOID FACE/GROIN/AXILLARY    . calcipotriene (DOVONOX) 0.005 % cream APPLY TO AFFECTED AREA TWICE A DAY 60 g 11  . cetirizine (ZYRTEC) 10 MG tablet Take 10 mg by mouth daily.    . cyclobenzaprine (FLEXERIL) 5 MG tablet ONE TAB TWICE A DAY, MAY CAUSE DROWSINESS, 15 DAYS    . diphenhydrAMINE (BENADRYL) 25 mg capsule Take by mouth.    . gabapentin (NEURONTIN) 300 MG capsule ONE TAB TWICE A DAY    . Halobetasol Propionate (LEXETTE) 0.05 % FOAM APPLY TOPICALLY TO THE AFFECTED AREA TWICE DAILY AS NEEDED ITCHING up to two weeks. AVOID FACE, GROIN, underarms] 50 g 11  . ibuprofen (ADVIL) 200 MG tablet TAKE 400 MG TWICE A DAY WITH FOOD X 15 DAYS    . Multiple Vitamin (MULTI-VITAMIN) tablet Take 1 tablet by mouth daily.    Marland Kitchen triamcinolone cream (KENALOG) 0.1 % Apply 1 application topically 2 (two) times daily as  needed. 45 g 5   No current facility-administered medications on file prior to visit.    No Known Allergies  Past Medical History:  Diagnosis Date  . Allergy   . Hyperlipidemia   . Hypertension   . Testosterone deficiency    Dr Jacqlyn Larsen treated in past    Past Surgical History:  Procedure Laterality Date  . CYSTECTOMY  03/2014   cyst removed from lower lumbar  . VASECTOMY Bilateral 2011    Family History  Problem Relation Age of Onset  . Skin cancer Mother   . Heart disease Father   . Diabetes Father     Social History   Socioeconomic History  . Marital status: Divorced    Spouse name: Not on file  . Number of children: 2  . Years of education: Not on file  . Highest education level: Not on file  Occupational History  . Occupation: Actor  Tobacco Use  . Smoking status: Never Smoker  . Smokeless tobacco: Never Used  Substance and Sexual Activity  . Alcohol use: Yes    Alcohol/week: 2.0 standard drinks    Types: 2 Standard drinks or equivalent per week  . Drug use: No  . Sexual activity: Not on file  Other Topics Concern  . Not on file  Social History Narrative   Divorced   Remarried 2012--then divorced again   Social Determinants of Health  Financial Resource Strain: Not on file  Food Insecurity: Not on file  Transportation Needs: Not on file  Physical Activity: Not on file  Stress: Not on file  Social Connections: Not on file  Intimate Partner Violence: Not on file   Review of Systems No fever Not sick  No dysuria or urgency    Objective:   Physical Exam Genitourinary:    Testes: Normal.     Comments: Prominent spermatic cord on right testes---testes itself is normal and ~ the same as on left. No tenderness or inflammation           Assessment & Plan:

## 2020-02-16 NOTE — Assessment & Plan Note (Signed)
Reassured him that this is not concerning--and it is not the testis itself Would set up with urologist if gets larger or more tender

## 2020-05-10 ENCOUNTER — Ambulatory Visit (INDEPENDENT_AMBULATORY_CARE_PROVIDER_SITE_OTHER): Payer: Federal, State, Local not specified - PPO | Admitting: Internal Medicine

## 2020-05-10 ENCOUNTER — Encounter: Payer: Self-pay | Admitting: Internal Medicine

## 2020-05-10 ENCOUNTER — Other Ambulatory Visit: Payer: Self-pay

## 2020-05-10 ENCOUNTER — Other Ambulatory Visit: Payer: Self-pay | Admitting: Dermatology

## 2020-05-10 VITALS — BP 110/80 | HR 100 | Temp 98.0°F | Ht 72.5 in | Wt 208.0 lb

## 2020-05-10 DIAGNOSIS — L409 Psoriasis, unspecified: Secondary | ICD-10-CM | POA: Diagnosis not present

## 2020-05-10 DIAGNOSIS — Z23 Encounter for immunization: Secondary | ICD-10-CM

## 2020-05-10 DIAGNOSIS — Z125 Encounter for screening for malignant neoplasm of prostate: Secondary | ICD-10-CM

## 2020-05-10 DIAGNOSIS — R002 Palpitations: Secondary | ICD-10-CM

## 2020-05-10 DIAGNOSIS — Z0000 Encounter for general adult medical examination without abnormal findings: Secondary | ICD-10-CM | POA: Diagnosis not present

## 2020-05-10 LAB — COMPREHENSIVE METABOLIC PANEL
ALT: 33 U/L (ref 0–53)
AST: 23 U/L (ref 0–37)
Albumin: 4.3 g/dL (ref 3.5–5.2)
Alkaline Phosphatase: 76 U/L (ref 39–117)
BUN: 15 mg/dL (ref 6–23)
CO2: 29 mEq/L (ref 19–32)
Calcium: 10.3 mg/dL (ref 8.4–10.5)
Chloride: 102 mEq/L (ref 96–112)
Creatinine, Ser: 1.11 mg/dL (ref 0.40–1.50)
GFR: 73.52 mL/min (ref >60.00)
Glucose, Bld: 89 mg/dL (ref 70–99)
Potassium: 4.4 mEq/L (ref 3.5–5.1)
Sodium: 139 mEq/L (ref 135–145)
Total Bilirubin: 0.7 mg/dL (ref 0.2–1.2)
Total Protein: 7.4 g/dL (ref 6.0–8.3)

## 2020-05-10 LAB — LIPID PANEL
Cholesterol: 230 mg/dL — ABNORMAL HIGH (ref 0–200)
HDL: 49.4 mg/dL (ref >39.00)
NonHDL: 180.96
Total CHOL/HDL Ratio: 5
Triglycerides: 306 mg/dL — ABNORMAL HIGH (ref 0.0–149.0)
VLDL: 61.2 mg/dL — ABNORMAL HIGH (ref 0.0–40.0)

## 2020-05-10 LAB — CBC
HCT: 46.9 % (ref 39.0–52.0)
Hemoglobin: 16.3 g/dL (ref 13.0–17.0)
MCHC: 34.7 g/dL (ref 30.0–36.0)
MCV: 83.8 fl (ref 78.0–100.0)
Platelets: 232 10*3/uL (ref 150.0–400.0)
RBC: 5.6 Mil/uL (ref 4.22–5.81)
RDW: 13.7 % (ref 11.5–15.5)
WBC: 7.7 10*3/uL (ref 4.0–10.5)

## 2020-05-10 LAB — T4, FREE: Free T4: 0.71 ng/dL (ref 0.60–1.60)

## 2020-05-10 LAB — PSA: PSA: 1.21 ng/mL (ref 0.10–4.00)

## 2020-05-10 LAB — LDL CHOLESTEROL, DIRECT: Direct LDL: 178 mg/dL

## 2020-05-10 NOTE — Assessment & Plan Note (Signed)
Healthy but needs to work on fitness Will check PSA Colon due next year again Will give shingrix He is not sure about COVID booster Flu vaccine in the fall

## 2020-05-10 NOTE — Progress Notes (Signed)
Subjective:    Patient ID: Randy Dillon, male    DOB: Aug 26, 1960, 60 y.o.   MRN: [id number]  HPI Here for physical This visit occurred during the SARS-CoV-2 public health emergency.  Safety protocols were in place, including screening questions prior to the visit, additional usage of staff PPE, and extensive cleaning of exam room while observing appropriate contact time as indicated for disinfecting solutions.   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 Also takes benedryl/flexeril Gets cortisone shot in hands a couple times a year also  Rarely uses flexeril at night--for his back  Current Outpatient Medications on File Prior to Visit  Medication Sig Dispense Refill  . calcipotriene (DOVONOX) 0.005 % cream APPLY TO AFFECTED AREA TWICE A DAY 60 g 11  . cetirizine (ZYRTEC) 10 MG tablet Take 10 mg by mouth daily.    . cyclobenzaprine (FLEXERIL) 5 MG tablet ONE TAB TWICE A DAY, MAY CAUSE DROWSINESS, 15 DAYS    . diphenhydrAMINE (BENADRYL) 25 mg capsule Take by mouth.    . gabapentin (NEURONTIN) 300 MG capsule ONE TAB TWICE A DAY    . Glucosamine-Chondroit-Vit C-Mn (GLUCOSAMINE CHONDR 1500 COMPLX PO) Take by mouth 2 (two) times daily.    Marland Kitchen Halobetasol Propionate (LEXETTE) 0.05 % FOAM APPLY TOPICALLY TO THE AFFECTED AREA TWICE DAILY AS NEEDED ITCHING up to two weeks. AVOID FACE, GROIN, underarms] 50 g 11  . ibuprofen (ADVIL) 200 MG tablet TAKE 400 MG TWICE A DAY WITH  FOOD X 15 DAYS    . Multiple Vitamin (MULTI-VITAMIN) tablet Take 1 tablet by mouth daily.    . Turmeric 450 MG CAPS Take by mouth.     No current facility-administered medications on file prior to visit.    No Known Allergies  Past Medical History:  Diagnosis Date  . Allergy   . Hyperlipidemia   . Hypertension   . Testosterone deficiency    Dr Jacqlyn Larsen treated in past    Past Surgical History:  Procedure Laterality Date  . CYSTECTOMY  03/2014   cyst removed from lower lumbar  . VASECTOMY Bilateral 2011    Family History  Problem Relation Age of Onset  . Skin cancer Mother   . Heart disease Father   . Diabetes Father     Social History   Socioeconomic History  . Marital status: Divorced    Spouse name: Not on file  . Number of children: 2  . Years of education: Not on file  . Highest education level: Not on file  Occupational History  . Occupation: Actor  Tobacco Use  . Smoking status: Never Smoker  . Smokeless tobacco: Never Used  Substance and Sexual Activity  . Alcohol use: Yes    Alcohol/week: 2.0 standard drinks    Types: 2 Standard drinks or equivalent per week  . Drug use: No  . Sexual activity: Not on file  Other Topics Concern  . Not on file  Social History Narrative  Divorced   Remarried 2012--then divorced again   Social Determinants of Health   Financial Resource Strain: Not on file  Food Insecurity: Not on file  Transportation Needs: Not on file  Physical Activity: Not on file  Stress: Not on file  Social Connections: Not on file  Intimate Partner Violence: Not on file   Review of Systems  Constitutional: Negative for fatigue and unexpected weight change.       Wears seat belt  HENT: Positive for hearing loss and tinnitus. Negative for dental problem.   Eyes: Negative for visual disturbance.       No diplopia or unilateral vision loss  Respiratory: Negative for cough, chest tightness and shortness of breath.   Cardiovascular:  Positive for palpitations. Negative for chest pain and leg swelling.  Gastrointestinal: Negative for constipation.       Some blood with hemorrhoids last week---preparation H helped  Endocrine: Negative for polydipsia and polyuria.  Genitourinary: Negative for difficulty urinating and urgency.  Musculoskeletal: Positive for arthralgias.       Some swelling in left CMC--especially after playing guitar  Skin:       Psoriasis largely controlled Still some on elbows and hands  Allergic/Immunologic: Positive for environmental allergies. Negative for immunocompromised state.  Neurological: Negative for dizziness, syncope and light-headedness.       Some headaches at work--ibuprofen helps (more noticeable since COVID vaccine)  Hematological: Negative for adenopathy. Does not bruise/bleed easily.  Psychiatric/Behavioral: Negative for dysphoric mood and sleep disturbance.       Some anxiety still Some decrease in "drive"       Objective:   Physical Exam Constitutional:      Appearance: Normal appearance.  HENT:     Right Ear: Tympanic membrane, ear canal and external ear normal.     Left Ear: Tympanic membrane, ear canal and external ear normal.     Mouth/Throat:     Pharynx: No oropharyngeal exudate or posterior oropharyngeal erythema.  Eyes:     Conjunctiva/sclera: Conjunctivae normal.     Pupils: Pupils are equal, round, and reactive to light.  Cardiovascular:     Rate and Rhythm: Normal rate and regular rhythm.     Pulses: Normal pulses.     Heart sounds: No murmur heard. No gallop.      Comments: HR now in 70's Pulmonary:     Effort: Pulmonary effort is normal.     Breath sounds: Normal breath sounds. No wheezing or rales.  Abdominal:     Palpations: Abdomen is soft.     Tenderness: There is no abdominal tenderness.  Musculoskeletal:     Cervical back: Neck supple.     Right lower leg: No edema.     Left lower leg: No edema.     Comments: Slight left heel  tenderness--discussed plantar fasciitis  Lymphadenopathy:     Cervical: No cervical adenopathy.  Skin:    General: Skin is warm.     Findings: No lesion.  Neurological:     General: No focal deficit present.     Mental Status: He is alert and oriented to person, place, and time.  Psychiatric:        Mood and Affect: Mood normal.        Behavior: Behavior normal.            Assessment & Plan:

## 2020-05-10 NOTE — Assessment & Plan Note (Signed)
Controlled on otezla

## 2020-05-10 NOTE — Assessment & Plan Note (Signed)
Seems related to acid symptoms Will check labs Rx for the GERD

## 2020-05-10 NOTE — Patient Instructions (Addendum)
Please try over the counter omeprazole (prilosec) 20mg  daily on an empty stomach for a week or so---and then repeat that if your acid symptoms act up.  Stop the benedryl. Try another cetirizine at bedtime or 1-2 loratadine 10mg .

## 2020-05-10 NOTE — Addendum Note (Signed)
Addended by: Pilar Grammes on: 05/10/2020 12:34 PM   Modules accepted: Orders

## 2020-05-31 DIAGNOSIS — Z03818 Encounter for observation for suspected exposure to other biological agents ruled out: Secondary | ICD-10-CM | POA: Diagnosis not present

## 2020-05-31 DIAGNOSIS — Z20822 Contact with and (suspected) exposure to covid-19: Secondary | ICD-10-CM | POA: Diagnosis not present

## 2020-06-21 DIAGNOSIS — M1812 Unilateral primary osteoarthritis of first carpometacarpal joint, left hand: Secondary | ICD-10-CM | POA: Diagnosis not present

## 2020-06-21 DIAGNOSIS — M545 Low back pain, unspecified: Secondary | ICD-10-CM | POA: Diagnosis not present

## 2020-06-21 DIAGNOSIS — M25542 Pain in joints of left hand: Secondary | ICD-10-CM | POA: Diagnosis not present

## 2020-06-21 DIAGNOSIS — L409 Psoriasis, unspecified: Secondary | ICD-10-CM | POA: Diagnosis not present

## 2020-08-02 ENCOUNTER — Other Ambulatory Visit: Payer: Self-pay

## 2020-08-02 ENCOUNTER — Ambulatory Visit (INDEPENDENT_AMBULATORY_CARE_PROVIDER_SITE_OTHER): Payer: Federal, State, Local not specified - PPO

## 2020-08-02 DIAGNOSIS — Z23 Encounter for immunization: Secondary | ICD-10-CM | POA: Diagnosis not present

## 2020-08-02 NOTE — Progress Notes (Signed)
  Per orders of Dr. Silvio Pate, injection of Shingrix #2 given by Kris Mouton. Patient tolerated injection well.

## 2020-08-31 ENCOUNTER — Other Ambulatory Visit: Payer: Self-pay

## 2020-08-31 ENCOUNTER — Ambulatory Visit: Payer: Federal, State, Local not specified - PPO | Admitting: Dermatology

## 2020-08-31 DIAGNOSIS — D229 Melanocytic nevi, unspecified: Secondary | ICD-10-CM

## 2020-08-31 DIAGNOSIS — Z1283 Encounter for screening for malignant neoplasm of skin: Secondary | ICD-10-CM

## 2020-08-31 DIAGNOSIS — L409 Psoriasis, unspecified: Secondary | ICD-10-CM | POA: Diagnosis not present

## 2020-08-31 DIAGNOSIS — L400 Psoriasis vulgaris: Secondary | ICD-10-CM

## 2020-08-31 DIAGNOSIS — D492 Neoplasm of unspecified behavior of bone, soft tissue, and skin: Secondary | ICD-10-CM

## 2020-08-31 DIAGNOSIS — D1800 Hemangioma unspecified site: Secondary | ICD-10-CM

## 2020-08-31 DIAGNOSIS — D2372 Other benign neoplasm of skin of left lower limb, including hip: Secondary | ICD-10-CM

## 2020-08-31 DIAGNOSIS — L578 Other skin changes due to chronic exposure to nonionizing radiation: Secondary | ICD-10-CM | POA: Diagnosis not present

## 2020-08-31 DIAGNOSIS — L814 Other melanin hyperpigmentation: Secondary | ICD-10-CM

## 2020-08-31 DIAGNOSIS — L821 Other seborrheic keratosis: Secondary | ICD-10-CM

## 2020-08-31 MED ORDER — OTEZLA 30 MG PO TABS: 30.0000 mg | ORAL_TABLET | Freq: Two times a day (BID) | ORAL | 2 refills | Status: DC

## 2020-08-31 MED ORDER — OTEZLA 30 MG PO TABS: 1.0000 | ORAL_TABLET | Freq: Two times a day (BID) | ORAL | 11 refills | Status: DC

## 2020-08-31 MED ORDER — OTEZLA 30 MG PO TABS: 30.0000 mg | ORAL_TABLET | Freq: Two times a day (BID) | ORAL | 11 refills | Status: DC

## 2020-08-31 MED ORDER — OTEZLA 30 MG PO TABS: 1.0000 | ORAL_TABLET | Freq: Two times a day (BID) | ORAL | 2 refills | Status: DC

## 2020-08-31 NOTE — Progress Notes (Signed)
Follow-Up Visit   Subjective  Randy Dillon is a 60 y.o. male who presents for the following: Annual Exam (Mole check, hx of Psoriasis ) and Psoriasis (F/u on psoriasis on hands treating with Otezla 30 mg tablets and Halobetasol cream prn). The patient presents for Total-Body Skin Exam (TBSE) for skin cancer screening and mole check.   The following portions of the chart were reviewed this encounter and updated as appropriate:   Tobacco  Allergies  Meds  Problems  Med Hx  Surg Hx  Fam Hx      Review of Systems:  No other skin or systemic complaints except as noted in HPI or Assessment and Plan.  Objective  Well appearing patient in no apparent distress; mood and affect are within normal limits.  A full examination was performed including scalp, head, eyes, ears, nose, lips, neck, chest, axillae, abdomen, back, buttocks, bilateral upper extremities, bilateral lower extremities, hands, feet, fingers, toes, fingernails, and toenails. All findings within normal limits unless otherwise noted below.  hands Few scaly pink patches   nasal supratip 0.1 cm blue brown macule, not changing by history    Assessment & Plan  Psoriasis hands  Chronic condition with duration or expected duration over one year. Condition is bothersome to patient. Not currently at goal.  Denies new joint pain.   Continue Otezla as prescribed.  Side effects of Otezla (apremilast) include diarrhea, nausea, headache, upper respiratory infection, depression, and weight decrease (5-10%). It should only be taken by pregnant women after a discussion regarding risks and benefits with their doctor. Goal is control of skin condition, not cure.  The use of [place] requires long term medication management, including periodic office visits.   Continue Halobetasol foam  prescribed. Can use Halobetasol foam twice daily for two weeks to calm current flare. Avoid applying Halobetasol  to face, groin, and axilla. Use as  directed. Risk of skin atrophy with long-term use reviewed.   Start [place], will provide samples. If this is helpful, he will let us know and we will prescribe  Apremilast (OTEZLA) 30 MG TABS - hands Take 1 tablet (30 mg total) by mouth 2 (two) times daily.  Related Medications Halobetasol Propionate (LEXETTE) 0.05 % FOAM APPLY TOPICALLY TO THE AFFECTED AREA TWICE DAILY AS NEEDED ITCHING up to two weeks. AVOID FACE, GROIN, underarms]  Neoplasm of skin nasal supratip  Favor Blue nevus   Benign-appearing.  Observation.  Call clinic for new or changing lesions.  Recommend daily use of broad spectrum spf 30+ sunscreen to sun-exposed areas.   Lentigines - Scattered tan macules - Due to sun exposure - Benign-appering, observe - Recommend daily broad spectrum sunscreen SPF 30+ to sun-exposed areas, reapply every 2 hours as needed. - Call for any changes  Seborrheic Keratoses - Stuck-on, waxy, tan-brown papules and/or plaques  - Benign-appearing - Discussed benign etiology and prognosis. - Observe - Call for any changes  Melanocytic Nevi - Tan-brown and/or pink-flesh-colored symmetric macules and papules - Benign appearing on exam today - Observation - Call clinic for new or changing moles - Recommend daily use of broad spectrum spf 30+ sunscreen to sun-exposed areas.   Hemangiomas - Red papules - Discussed benign nature - Observe - Call for any changes  Actinic Damage - Chronic condition, secondary to cumulative UV/sun exposure - diffuse scaly erythematous macules with underlying dyspigmentation - Recommend daily broad spectrum sunscreen SPF 30+ to sun-exposed areas, reapply every 2 hours as needed.  - Staying in the shade  or wearing long sleeves, sun glasses (UVA+UVB protection) and wide brim hats (4-inch brim around the entire circumference of the hat) are also recommended for sun protection.  - Call for new or changing lesions.  Dermatofibroma Left pretibial  - Firm  pink/brown papulenodule with dimple sign - Benign appearing - Call for any changes  Skin cancer screening performed today.   Return in about 1 year (around 08/31/2021) for TBSE.  I, Marye Round, CMA, am acting as scribe for Forest Gleason, MD .   Documentation: I have reviewed the above documentation for accuracy and completeness, and I agree with the above.  Forest Gleason, MD

## 2020-08-31 NOTE — Patient Instructions (Addendum)
Recommend taking Heliocare sun protection supplement daily in sunny weather for additional sun protection. For maximum protection on the sunniest days, you can take up to 2 capsules of regular Heliocare OR take 1 capsule of Heliocare Ultra. For prolonged exposure (such as a full day in the sun), you can repeat your dose of the supplement 4 hours after your first dose. Heliocare can be purchased at [facility] or at [email or web address].    If you have any questions or concerns for your doctor, please call our main line at [phone] and press option 4 to reach your doctor's medical assistant. If no one answers, please leave a voicemail as directed and we will return your call as soon as possible. Messages left after 4 pm will be answered the following business day.   You may also send us a message via MyChart. We typically respond to MyChart messages within 1-2 business days.  For prescription refills, please ask your pharmacy to contact our office. Our fax number is [phone].  If you have an urgent issue when the clinic is closed that cannot wait until the next business day, you can page your doctor at the number below.    Please note that while we do our best to be available for urgent issues outside of office hours, we are not available 24/7.   If you have an urgent issue and are unable to reach us, you may choose to seek medical care at your doctor's office, retail clinic, urgent care center, or emergency room.  If you have a medical emergency, please immediately call 911 or go to the emergency department.  Pager Numbers  - Dr. Kowalski: [phone]  - Dr. Moye: [phone]  - Dr. Stewart: [phone]  In the event of inclement weather, please call our main line at [phone] for an update on the status of any delays or closures.  Dermatology Medication Tips: Please keep the boxes that topical medications come in in order to help keep track of the instructions about  where and how to use these. Pharmacies typically print the medication instructions only on the boxes and not directly on the medication tubes.   If your medication is too expensive, please contact our office at [phone] option 4 or send us a message through MyChart.   We are unable to tell what your co-pay for medications will be in advance as this is different depending on your insurance coverage. However, we may be able to find a substitute medication at lower cost or fill out paperwork to get insurance to cover a needed medication.   If a prior authorization is required to get your medication covered by your insurance company, please allow us 1-2 business days to complete this process.  Drug prices often vary depending on where the prescription is filled and some pharmacies may offer cheaper prices.  The website www.goodrx.com contains coupons for medications through different pharmacies. The prices here do not account for what the cost may be with help from insurance (it may be cheaper with your insurance), but the website can give you the price if you did not use any insurance.  - You can print the associated coupon and take it with your prescription to the pharmacy.  - You may also stop by our office during regular business hours and pick up a GoodRx coupon card.  - If you need your prescription sent electronically to a different pharmacy, notify our office through [place] MyChart or by phone at [phone] option   4.  

## 2020-08-31 NOTE — Progress Notes (Signed)
Patient no longer uses [facility] instead he uses [facility].

## 2020-09-07 ENCOUNTER — Telehealth: Payer: Self-pay | Admitting: Dermatology

## 2020-09-07 NOTE — Telephone Encounter (Signed)
Please leave samples (2-3) of Vtama at the front desk and let patient know they are there for him. Thank you!

## 2020-09-09 ENCOUNTER — Encounter: Payer: Self-pay | Admitting: Dermatology

## 2020-09-11 ENCOUNTER — Telehealth: Payer: Self-pay

## 2020-09-11 NOTE — Telephone Encounter (Signed)
Samples of Vtama x 2 left at front desk for patient to use once daily to affected areas. Patient advised, will pick up Wednesday. Lot # [id number]  Exp: Oct 2023.Cherly Hensen

## 2021-01-17 DIAGNOSIS — M1812 Unilateral primary osteoarthritis of first carpometacarpal joint, left hand: Secondary | ICD-10-CM | POA: Diagnosis not present

## 2021-01-17 DIAGNOSIS — M25542 Pain in joints of left hand: Secondary | ICD-10-CM | POA: Diagnosis not present

## 2021-01-17 DIAGNOSIS — L409 Psoriasis, unspecified: Secondary | ICD-10-CM | POA: Diagnosis not present

## 2021-01-17 DIAGNOSIS — M545 Low back pain, unspecified: Secondary | ICD-10-CM | POA: Diagnosis not present

## 2021-07-04 DIAGNOSIS — M545 Low back pain, unspecified: Secondary | ICD-10-CM | POA: Diagnosis not present

## 2021-07-04 DIAGNOSIS — M1812 Unilateral primary osteoarthritis of first carpometacarpal joint, left hand: Secondary | ICD-10-CM | POA: Diagnosis not present

## 2021-07-04 DIAGNOSIS — L409 Psoriasis, unspecified: Secondary | ICD-10-CM | POA: Diagnosis not present

## 2021-07-04 DIAGNOSIS — M19041 Primary osteoarthritis, right hand: Secondary | ICD-10-CM | POA: Diagnosis not present

## 2021-07-04 DIAGNOSIS — M25542 Pain in joints of left hand: Secondary | ICD-10-CM | POA: Diagnosis not present

## 2021-08-02 DIAGNOSIS — H5213 Myopia, bilateral: Secondary | ICD-10-CM | POA: Diagnosis not present

## 2021-08-30 ENCOUNTER — Other Ambulatory Visit: Payer: Self-pay | Admitting: Dermatology

## 2021-08-30 DIAGNOSIS — L400 Psoriasis vulgaris: Secondary | ICD-10-CM

## 2021-09-05 ENCOUNTER — Encounter: Payer: Federal, State, Local not specified - PPO | Admitting: Dermatology

## 2021-09-19 ENCOUNTER — Ambulatory Visit: Payer: Federal, State, Local not specified - PPO | Admitting: Dermatology

## 2021-09-19 DIAGNOSIS — L400 Psoriasis vulgaris: Secondary | ICD-10-CM | POA: Diagnosis not present

## 2021-09-19 DIAGNOSIS — L821 Other seborrheic keratosis: Secondary | ICD-10-CM

## 2021-09-19 DIAGNOSIS — L409 Psoriasis, unspecified: Secondary | ICD-10-CM

## 2021-09-19 DIAGNOSIS — L814 Other melanin hyperpigmentation: Secondary | ICD-10-CM | POA: Diagnosis not present

## 2021-09-19 DIAGNOSIS — D229 Melanocytic nevi, unspecified: Secondary | ICD-10-CM

## 2021-09-19 DIAGNOSIS — Z1283 Encounter for screening for malignant neoplasm of skin: Secondary | ICD-10-CM | POA: Diagnosis not present

## 2021-09-19 DIAGNOSIS — D1800 Hemangioma unspecified site: Secondary | ICD-10-CM

## 2021-09-19 DIAGNOSIS — L739 Follicular disorder, unspecified: Secondary | ICD-10-CM | POA: Diagnosis not present

## 2021-09-19 DIAGNOSIS — L578 Other skin changes due to chronic exposure to nonionizing radiation: Secondary | ICD-10-CM

## 2021-09-19 MED ORDER — OTEZLA 30 MG PO TABS: 30.0000 mg | ORAL_TABLET | Freq: Two times a day (BID) | ORAL | 11 refills | Status: DC

## 2021-09-19 MED ORDER — ZORYVE 0.3 % EX CREA: TOPICAL_CREAM | CUTANEOUS | 2 refills | Status: DC

## 2021-09-19 NOTE — Patient Instructions (Addendum)
Psoriasis is a chronic non-curable, but treatable genetic/hereditary disease that may have other systemic features affecting other organ systems such as joints (Psoriatic Arthritis). It is associated with an increased risk of inflammatory bowel disease, heart disease, non-alcoholic fatty liver disease, and depression.    Continue Otezla 30 mg twice daily, may decrease to once daily. Continue halobetasol foam twice daily for up to 2 weeks as needed for flares. Avoid applying to face, groin, and axilla. Use as directed. Long-term use can cause thinning of the skin. Start Zoryve once daily as needed.   Topical steroids (such as triamcinolone, fluocinolone, fluocinonide, mometasone, clobetasol, halobetasol, betamethasone, hydrocortisone) can cause thinning and lightening of the skin if they are used for too long in the same area. Your physician has selected the right strength medicine for your problem and area affected on the body. Please use your medication only as directed by your physician to prevent side effects.   Recommend taking Heliocare sun protection supplement daily in sunny weather for additional sun protection. For maximum protection on the sunniest days, you can take up to 2 capsules of regular Heliocare OR take 1 capsule of Heliocare Ultra. For prolonged exposure (such as a full day in the sun), you can repeat your dose of the supplement 4 hours after your first dose. Heliocare can be purchased at [employer], at some [facility] or at [email or web address].    Melanoma ABCDEs  Melanoma is the most dangerous type of skin cancer, and is the leading cause of death from skin disease.  You are more likely to develop melanoma if you: Have light-colored skin, light-colored eyes, or red or blond hair Spend a lot of time in the sun Tan regularly, either outdoors or in a tanning bed Have had blistering sunburns, especially during childhood Have a close family member who has had a melanoma Have  atypical moles or large birthmarks  Early detection of melanoma is key since treatment is typically straightforward and cure rates are extremely high if we catch it early.   The first sign of melanoma is often a change in a mole or a new dark spot.  The ABCDE system is a way of remembering the signs of melanoma.  A for asymmetry:  The two halves do not match. B for border:  The edges of the growth are irregular. C for color:  A mixture of colors are present instead of an even brown color. D for diameter:  Melanomas are usually (but not always) greater than 80m - the size of a pencil eraser. E for evolution:  The spot keeps changing in size, shape, and color.  Please check your skin once per month between visits. You can use a small mirror in front and a large mirror behind you to keep an eye on the back side or your body.   If you see any new or changing lesions before your next follow-up, please call to schedule a visit.  Please continue daily skin protection including broad spectrum sunscreen SPF 30+ to sun-exposed areas, reapplying every 2 hours as needed when you're outdoors.    Due to recent changes in healthcare laws, you may see results of your pathology and/or laboratory studies on MyChart before the doctors have had a chance to review them. We understand that in some cases there may be results that are confusing or concerning to you. Please understand that not all results are received at the same time and often the doctors may need to interpret multiple results  in order to provide you with the best plan of care or course of treatment. Therefore, we ask that you please give [place] 2 business days to thoroughly review all your results before contacting the office for clarification. Should we see a critical lab result, you will be contacted sooner.   If You Need Anything After Your Visit  If you have any questions or concerns for your doctor, please call our main line at [phone] and  press option 4 to reach your doctor's medical assistant. If no one answers, please leave a voicemail as directed and we will return your call as soon as possible. Messages left after 4 pm will be answered the following business day.   You may also send [place] a message via [place]. We typically respond to MyChart messages within 1-2 business days.  For prescription refills, please ask your pharmacy to contact our office. Our fax number is [phone].  If you have an urgent issue when the clinic is closed that cannot wait until the next business day, you can page your doctor at the number below.    Please note that while we do our best to be available for urgent issues outside of office hours, we are not available 24/7.   If you have an urgent issue and are unable to reach [place], you may choose to seek medical care at your doctor's office, retail clinic, urgent care center, or emergency room.  If you have a medical emergency, please immediately call 911 or go to the emergency department.  Pager Numbers  - Dr. Nehemiah Massed: [phone]  - Dr. Laurence Ferrari: [phone]  - Dr. Nicole Kindred: [phone]  In the event of inclement weather, please call our main line at [phone] for an update on the status of any delays or closures.  Dermatology Medication Tips: Please keep the boxes that topical medications come in in order to help keep track of the instructions about where and how to use these. Pharmacies typically print the medication instructions only on the boxes and not directly on the medication tubes.   If your medication is too expensive, please contact our office at [phone] option 4 or send [place] a message through [place].   We are unable to tell what your co-pay for medications will be in advance as this is different depending on your insurance coverage. However, we may be able to find a substitute medication at lower cost or fill out paperwork to get insurance to cover a needed medication.   If  a prior authorization is required to get your medication covered by your insurance company, please allow [place] 1-2 business days to complete this process.  Drug prices often vary depending on where the prescription is filled and some pharmacies may offer cheaper prices.  The website www.goodrx.com contains coupons for medications through different pharmacies. The prices here do not account for what the cost may be with help from insurance (it may be cheaper with your insurance), but the website can give you the price if you did not use any insurance.  - You can print the associated coupon and take it with your prescription to the pharmacy.  - You may also stop by our office during regular business hours and pick up a GoodRx coupon card.  - If you need your prescription sent electronically to a different pharmacy, notify our office through [facility] or by phone at [phone] option 4.     Si Usted Necesita Algo Despus de Su Visita  Tambin puede  enviarnos un mensaje a travs de MyChart. Por lo general respondemos a los mensajes de MyChart en el transcurso de 1 a 2 das hbiles.  Para renovar recetas, por favor pida a su farmacia que se ponga en contacto con nuestra oficina. Harland Dingwall de fax es [place] [phone].  Si tiene un asunto urgente cuando la clnica est cerrada y que no puede esperar hasta el siguiente da hbil, puede llamar/localizar a su doctor(a) al nmero que aparece a continuacin.   Por favor, tenga en cuenta que aunque hacemos todo lo posible para estar disponibles para asuntos urgentes fuera del horario de [place], no estamos disponibles las 24 horas del da, los 7 das de la [place].   Si tiene un problema urgente y no puede comunicarse con nosotros, puede optar por buscar atencin mdica  en el consultorio de su doctor(a), en una clnica privada, en un centro de atencin urgente o en una sala de emergencias.  Si tiene Engineering geologist, por favor llame  inmediatamente al 911 o vaya a la sala de emergencias.  Nmeros de bper  - Dr. Nehemiah Massed: [phone]  - Dra. Moye: [phone]  - Dra. Nicole Kindred: [phone]  En caso de inclemencias del [place], por favor llame a Johnsie Kindred principal al [phone] para una actualizacin sobre el [place] de cualquier retraso o cierre.  Consejos para la medicacin en dermatologa: Por favor, guarde las cajas en las que vienen los medicamentos de uso tpico para ayudarle a seguir las instrucciones sobre dnde y cmo usarlos. Las farmacias generalmente imprimen las instrucciones del medicamento slo en las cajas y no directamente en los tubos del [place].   Si su medicamento es muy caro, por favor, pngase en contacto con Zigmund Daniel llamando al [phone] y presione la opcin 4 o envenos un mensaje a travs de Pharmacist, community.   No podemos decirle cul ser su copago por los medicamentos por adelantado ya que esto es diferente dependiendo de la cobertura de su seguro. Sin embargo, es posible que podamos encontrar un medicamento sustituto a Electrical engineer un formulario para que el seguro cubra el medicamento que se considera necesario.   Si se requiere una autorizacin previa para que su compaa de seguros [place] su medicamento, por favor permtanos de 1 a 2 das hbiles para completar este proceso.  Los precios de los medicamentos varan con frecuencia dependiendo del Environmental consultant de dnde se surte la receta y alguna farmacias pueden ofrecer precios ms baratos.  El sitio web www.goodrx.com tiene cupones para medicamentos de Airline pilot. Los precios aqu no tienen en cuenta lo que podra costar con la ayuda del seguro (puede ser ms barato con su seguro), pero el sitio web puede darle el precio si no utiliz Research scientist (physical sciences).  - Puede imprimir el cupn correspondiente y llevarlo con su receta a la farmacia.  - Tambin puede pasar por nuestra oficina durante el horario de atencin regular y Charity fundraiser  una tarjeta de cupones de GoodRx.  - Si necesita que su receta se enve electrnicamente a una farmacia diferente, informe a nuestra oficina a travs de MyChart de [place] o por telfono llamando al [phone] y presione la opcin 4.

## 2021-09-19 NOTE — Progress Notes (Signed)
Follow-Up Visit   Subjective  Randy Dillon is a 61 y.o. male who presents for the following: FBSE (The patient presents for Total-Body Skin Exam (TBSE) for skin cancer screening and mole check.  The patient has spots, moles and lesions to be evaluated, some may be new or changing and the patient has concerns that these could be cancer. No hx skin cancer. ) and Psoriasis (Patient taking Otezla. Rarely using topical halobetasol at hands.).  Family history of skin cancer - what type(s): unsure - who affected: mother, face   The following portions of the chart were reviewed this encounter and updated as appropriate:   Tobacco  Allergies  Meds  Problems  Med Hx  Surg Hx  Fam Hx      Review of Systems:  No other skin or systemic complaints except as noted in HPI or Assessment and Plan.  Objective  Well appearing patient in no apparent distress; mood and affect are within normal limits.  A full examination was performed including scalp, head, eyes, ears, nose, lips, neck, chest, axillae, abdomen, back, buttocks, bilateral upper extremities, bilateral lower extremities, hands, feet, fingers, toes, fingernails, and toenails. All findings within normal limits unless otherwise noted below.  b/l hands Scaly pink papules at left hand, scaly pink plaque at left elbow  back Perifollicular erythematous papules and pustules     Assessment & Plan  Psoriasis b/l hands  Psoriasis is a chronic non-curable, but treatable genetic/hereditary disease that may have other systemic features affecting other organ systems such as joints (Psoriatic Arthritis). It is associated with an increased risk of inflammatory bowel disease, heart disease, non-alcoholic fatty liver disease, and depression.    Chronic and persistent condition with duration or expected duration over one year. Condition is symptomatic/ bothersome to patient. Not currently at goal and with mild digestive upset on [place]  Can  continue Otezla 30 mg twice daily or consider decreasing to once daily.  Continue halobetasol foam twice daily for up to 2 weeks as needed for flares. Avoid applying to face, groin, and axilla. Use as directed. Long-term use can cause thinning of the skin.  Start Zoryve once daily as needed.   Topical steroids (such as triamcinolone, fluocinolone, fluocinonide, mometasone, clobetasol, halobetasol, betamethasone, hydrocortisone) can cause thinning and lightening of the skin if they are used for too long in the same area. Your physician has selected the right strength medicine for your problem and area affected on the body. Please use your medication only as directed by your physician to prevent side effects.   Patient does have osteoarthritis, is getting cortisone injections with rheumatologist, Dr. Posey Pronto.  Consider skyrizi if not doing well at follow-up.  Roflumilast (ZORYVE) 0.3 % CREA - b/l hands Use once daily to affected areas of psoriasis.  Related Medications Halobetasol Propionate (LEXETTE) 0.05 % FOAM APPLY TOPICALLY TO THE AFFECTED AREA TWICE DAILY AS NEEDED ITCHING up to two weeks. AVOID FACE, GROIN, underarms]  Apremilast (OTEZLA) 30 MG TABS Take 1 tablet (30 mg total) by mouth 2 (two) times daily.  Folliculitis back  Patient deferred treatment at this time.   Psoriasis vulgaris  Related Medications Apremilast (OTEZLA) 30 MG TABS Take 1 tablet (30 mg total) by mouth 2 (two) times daily.  Lentigines - Scattered tan macules - Due to sun exposure - Benign-appearing, observe - Recommend daily broad spectrum sunscreen SPF 30+ to sun-exposed areas, reapply every 2 hours as needed. - Call for any changes  Seborrheic Keratoses - Stuck-on, waxy,  tan-brown papules and/or plaques  - Benign-appearing - Discussed benign etiology and prognosis. - Observe - Call for any changes  Melanocytic Nevi - Tan-brown and/or pink-flesh-colored symmetric macules and papules - Benign  appearing on exam today - Observation - Call clinic for new or changing moles - Recommend daily use of broad spectrum spf 30+ sunscreen to sun-exposed areas.   Hemangiomas - Red papules - Discussed benign nature - Observe - Call for any changes  Actinic Damage - Chronic condition, secondary to cumulative UV/sun exposure - diffuse scaly erythematous macules with underlying dyspigmentation - Recommend daily broad spectrum sunscreen SPF 30+ to sun-exposed areas, reapply every 2 hours as needed.  - Staying in the shade or wearing long sleeves, sun glasses (UVA+UVB protection) and wide brim hats (4-inch brim around the entire circumference of the hat) are also recommended for sun protection.  - Call for new or changing lesions.  Skin cancer screening performed today.  Return in about 1 year (around 09/20/2022) for TBSE, Psoriasis.  Graciella Belton, RMA, am acting as scribe for Forest Gleason, MD .  Documentation: I have reviewed the above documentation for accuracy and completeness, and I agree with the above.  Forest Gleason, MD

## 2021-10-01 ENCOUNTER — Encounter: Payer: Self-pay | Admitting: Dermatology

## 2021-10-09 DIAGNOSIS — M1812 Unilateral primary osteoarthritis of first carpometacarpal joint, left hand: Secondary | ICD-10-CM | POA: Diagnosis not present

## 2021-10-09 DIAGNOSIS — L409 Psoriasis, unspecified: Secondary | ICD-10-CM | POA: Diagnosis not present

## 2021-10-09 DIAGNOSIS — M47816 Spondylosis without myelopathy or radiculopathy, lumbar region: Secondary | ICD-10-CM | POA: Diagnosis not present

## 2021-10-17 ENCOUNTER — Telehealth: Payer: Self-pay | Admitting: Internal Medicine

## 2021-10-17 NOTE — Telephone Encounter (Signed)
Patient called in and stated he hurt achilles tendon playing pickle ball. He stated he can walk but it very painful. He has iced it and its tender to the touch. He was wondering if he should see Dr. Silvio Pate or if he should just keep doing what he doing. He stated that on a scale of 1-10 the pain is a 8 only if he walking. Please advise. Thank you!

## 2021-10-17 NOTE — Telephone Encounter (Signed)
Spoke to pt. I gave him info on [facility]. He will go there.

## 2022-01-07 DIAGNOSIS — K219 Gastro-esophageal reflux disease without esophagitis: Secondary | ICD-10-CM | POA: Diagnosis not present

## 2022-01-07 DIAGNOSIS — Z86010 Personal history of colonic polyps: Secondary | ICD-10-CM | POA: Diagnosis not present

## 2022-01-10 DIAGNOSIS — M66861 Spontaneous rupture of other tendons, right lower leg: Secondary | ICD-10-CM | POA: Diagnosis not present

## 2022-02-22 ENCOUNTER — Other Ambulatory Visit: Payer: Self-pay

## 2022-02-22 DIAGNOSIS — K640 First degree hemorrhoids: Secondary | ICD-10-CM | POA: Diagnosis not present

## 2022-02-22 DIAGNOSIS — Z1211 Encounter for screening for malignant neoplasm of colon: Secondary | ICD-10-CM | POA: Diagnosis not present

## 2022-02-22 DIAGNOSIS — D123 Benign neoplasm of transverse colon: Secondary | ICD-10-CM | POA: Diagnosis not present

## 2022-02-22 DIAGNOSIS — Z86010 Personal history of colonic polyps: Secondary | ICD-10-CM | POA: Diagnosis not present

## 2022-03-07 ENCOUNTER — Encounter: Payer: Self-pay | Admitting: Internal Medicine

## 2022-03-07 ENCOUNTER — Ambulatory Visit (INDEPENDENT_AMBULATORY_CARE_PROVIDER_SITE_OTHER): Payer: Federal, State, Local not specified - PPO | Admitting: Internal Medicine

## 2022-03-07 VITALS — BP 122/78 | HR 70 | Temp 97.5°F | Ht 72.5 in | Wt 215.0 lb

## 2022-03-07 DIAGNOSIS — M159 Polyosteoarthritis, unspecified: Secondary | ICD-10-CM

## 2022-03-07 DIAGNOSIS — Z0000 Encounter for general adult medical examination without abnormal findings: Secondary | ICD-10-CM

## 2022-03-07 DIAGNOSIS — I10 Essential (primary) hypertension: Secondary | ICD-10-CM

## 2022-03-07 DIAGNOSIS — Z125 Encounter for screening for malignant neoplasm of prostate: Secondary | ICD-10-CM

## 2022-03-07 DIAGNOSIS — L409 Psoriasis, unspecified: Secondary | ICD-10-CM

## 2022-03-07 LAB — COMPREHENSIVE METABOLIC PANEL
ALT: 31 U/L (ref 0–53)
AST: 25 U/L (ref 0–37)
Albumin: 4.4 g/dL (ref 3.5–5.2)
Alkaline Phosphatase: 77 U/L (ref 39–117)
BUN: 13 mg/dL (ref 6–23)
CO2: 28 mEq/L (ref 19–32)
Calcium: 9.8 mg/dL (ref 8.4–10.5)
Chloride: 102 mEq/L (ref 96–112)
Creatinine, Ser: 1 mg/dL (ref 0.40–1.50)
GFR: 82.27 mL/min (ref >60.00)
Glucose, Bld: 93 mg/dL (ref 70–99)
Potassium: 4.4 mEq/L (ref 3.5–5.1)
Sodium: 138 mEq/L (ref 135–145)
Total Bilirubin: 0.7 mg/dL (ref 0.2–1.2)
Total Protein: 7.3 g/dL (ref 6.0–8.3)

## 2022-03-07 LAB — LIPID PANEL
Cholesterol: 250 mg/dL — ABNORMAL HIGH (ref 0–200)
HDL: 51.7 mg/dL (ref >39.00)
LDL Cholesterol: 164 mg/dL — ABNORMAL HIGH (ref 0–99)
NonHDL: 198.1
Total CHOL/HDL Ratio: 5
Triglycerides: 170 mg/dL — ABNORMAL HIGH (ref 0.0–149.0)
VLDL: 34 mg/dL (ref 0.0–40.0)

## 2022-03-07 LAB — PSA: PSA: 2.43 ng/mL (ref 0.10–4.00)

## 2022-03-07 LAB — CBC
HCT: 46.9 % (ref 39.0–52.0)
Hemoglobin: 16 g/dL (ref 13.0–17.0)
MCHC: 34.2 g/dL (ref 30.0–36.0)
MCV: 85.6 fl (ref 78.0–100.0)
Platelets: 194 10*3/uL (ref 150.0–400.0)
RBC: 5.48 Mil/uL (ref 4.22–5.81)
RDW: 14 % (ref 11.5–15.5)
WBC: 6 10*3/uL (ref 4.0–10.5)

## 2022-03-07 LAB — SEDIMENTATION RATE: Sed Rate: 10 mm/hr (ref 0–20)

## 2022-03-07 MED ORDER — GABAPENTIN 300 MG PO CAPS: ORAL_CAPSULE | ORAL | 3 refills | Status: AC

## 2022-03-07 MED ORDER — TADALAFIL 20 MG PO TABS: 10.0000 mg | ORAL_TABLET | ORAL | 11 refills | Status: AC | PRN

## 2022-03-07 NOTE — Assessment & Plan Note (Signed)
Worst in hands and back Gabapentin helps at times Getting injections from Dr Posey Pronto

## 2022-03-07 NOTE — Patient Instructions (Signed)
Please consider the [employer] instead of keto.

## 2022-03-07 NOTE — Progress Notes (Signed)
Subjective:    Patient ID: Randy Dillon, male    DOB: 01/06/1961, 62 y.o.   MRN: [id number]  HPI Here for physical  Some sinus issues---goes back a while Some drainage--occasional cough Will get some right teeth pain at times Cetirizine/flonase --some help No regular acid symptoms  Tore his right Achilles tendon Waited a long time--but then re-injured it Went to [facility]--healed but with scar tissue Now has joined [employer] Dr Posey Pronto for his arthritis Psoriasis controlled with [place] [facility] Worst area is hands---uses gabapentin twice a week or so (and helps back some also)  Current Outpatient Medications on File Prior to Visit  Medication Sig Dispense Refill   Apremilast (OTEZLA) 30 MG TABS Take 1 tablet (30 mg total) by mouth 2 (two) times daily. 60 tablet 11   cetirizine (ZYRTEC) 10 MG tablet Take 10 mg by mouth daily.     cyclobenzaprine (FLEXERIL) 5 MG tablet ONE TAB TWICE A DAY, MAY CAUSE DROWSINESS, 15 DAYS     diclofenac Sodium (VOLTAREN) 1 % GEL Apply 2 g topically 4 (four) times daily.     fluticasone (FLONASE) 50 MCG/ACT nasal spray Place into both nostrils daily as needed for allergies or rhinitis.     gabapentin (NEURONTIN) 300 MG capsule ONE TAB TWICE A DAY     Halobetasol Propionate (LEXETTE) 0.05 % FOAM APPLY TOPICALLY TO THE AFFECTED AREA TWICE DAILY AS NEEDED ITCHING up to two weeks. AVOID FACE, GROIN, underarms] 50 g 11   ibuprofen (ADVIL) 200 MG tablet TAKE 400 MG TWICE A DAY WITH FOOD X 15 DAYS     Multiple Vitamin (MULTI-VITAMIN) tablet Take 1 tablet by mouth daily.     Roflumilast (ZORYVE) 0.3 % CREA Use once daily to affected areas of psoriasis. 60 g 2   No current facility-administered medications on file prior to visit.    No Known Allergies  Past Medical History:  Diagnosis Date   Allergy    Hyperlipidemia    Hypertension    Testosterone deficiency    Dr Jacqlyn Larsen treated in past    Past Surgical History:  Procedure Laterality Date    CYSTECTOMY  03/2014   cyst removed from lower lumbar   VASECTOMY Bilateral 2011    Family History  Problem Relation Age of Onset   Skin cancer Mother    Heart disease Father    Diabetes Father     Social History   Socioeconomic History   Marital status: Divorced    Spouse name: Not on file   Number of children: 2   Years of education: Not on file   Highest education level: Not on file  Occupational History   Occupation: Postal service    Comment: Retired 2023  Tobacco Use   Smoking status: Never    Passive exposure: Past   Smokeless tobacco: Never  Substance and Sexual Activity   Alcohol use: Yes    Alcohol/week: 2.0 standard drinks of alcohol    Types: 2 Standard drinks or equivalent per week   Drug use: No   Sexual activity: Not on file  Other Topics Concern   Not on file  Social History Narrative   Divorced   Remarried 2012--then divorced again   Social Determinants of Health   Financial Resource Strain: Not on file  Food Insecurity: Not on file  Transportation Needs: Not on file  Physical Activity: Not on file  Stress: Not on file  Social Connections: Not on file  Intimate Partner  Violence: Not on file   Review of Systems  Constitutional:  Negative for fatigue.       Weight up slightly Wears seat belt  HENT:  Positive for hearing loss, postnasal drip and tinnitus. Negative for dental problem.   Eyes:  Negative for visual disturbance.       No diplopia or unilateral vision loss  Respiratory:  Negative for chest tightness and shortness of breath.   Cardiovascular:  Negative for chest pain, palpitations and leg swelling.  Gastrointestinal:  Negative for blood in stool and constipation.  Endocrine: Negative for polydipsia and polyuria.  Genitourinary:  Negative for difficulty urinating and urgency.       Some ED--ready to try something Stable relationship for 5 months  Musculoskeletal:  Positive for arthralgias and back pain.  Skin:        Psoriasis  controlled  Allergic/Immunologic: Positive for environmental allergies. Negative for immunocompromised state.  Neurological:  Negative for syncope, light-headedness and headaches.       May have brief orthostatic dizziness when bending over and then getting up  Hematological:  Negative for adenopathy. Does not bruise/bleed easily.  Psychiatric/Behavioral:  Negative for dysphoric mood and sleep disturbance.        Occasional mild anxiety       Objective:   Physical Exam Constitutional:      Appearance: Normal appearance.  HENT:     Mouth/Throat:     Pharynx: No oropharyngeal exudate or posterior oropharyngeal erythema.  Eyes:     Conjunctiva/sclera: Conjunctivae normal.     Pupils: Pupils are equal, round, and reactive to light.  Cardiovascular:     Rate and Rhythm: Normal rate and regular rhythm.     Pulses: Normal pulses.     Heart sounds:     No gallop.  Pulmonary:     Effort: Pulmonary effort is normal.     Breath sounds: Normal breath sounds. No wheezing or rales.  Abdominal:     Palpations: Abdomen is soft.     Tenderness: There is no abdominal tenderness.  Musculoskeletal:     Cervical back: Neck supple.     Right lower leg: No edema.     Left lower leg: No edema.     Comments: Thickening at top of right Achilles  Lymphadenopathy:     Cervical: No cervical adenopathy.  Skin:    Findings: No lesion or rash.  Neurological:     General: No focal deficit present.     Mental Status: He is alert and oriented to person, place, and time.  Psychiatric:        Mood and Affect: Mood normal.        Behavior: Behavior normal.            Assessment & Plan:

## 2022-03-07 NOTE — Assessment & Plan Note (Signed)
BP Readings from Last 3 Encounters:  03/07/22 122/78  05/10/20 110/80  02/16/20 118/80   Doing well without meds

## 2022-03-07 NOTE — Assessment & Plan Note (Signed)
Good control on otezla

## 2022-03-07 NOTE — Assessment & Plan Note (Signed)
Healthy Getting back to fitness Just had colon--due again in 3 years Discussed PSA--will check again Reluctant to take COVID vaccine--discussed Flu vaccine in the fall

## 2022-03-11 DIAGNOSIS — L409 Psoriasis, unspecified: Secondary | ICD-10-CM | POA: Diagnosis not present

## 2022-03-11 DIAGNOSIS — M25542 Pain in joints of left hand: Secondary | ICD-10-CM | POA: Diagnosis not present

## 2022-03-11 DIAGNOSIS — M19042 Primary osteoarthritis, left hand: Secondary | ICD-10-CM | POA: Diagnosis not present

## 2022-03-11 DIAGNOSIS — M47816 Spondylosis without myelopathy or radiculopathy, lumbar region: Secondary | ICD-10-CM | POA: Diagnosis not present

## 2022-03-11 DIAGNOSIS — M1812 Unilateral primary osteoarthritis of first carpometacarpal joint, left hand: Secondary | ICD-10-CM | POA: Diagnosis not present

## 2022-03-11 DIAGNOSIS — M19041 Primary osteoarthritis, right hand: Secondary | ICD-10-CM | POA: Diagnosis not present

## 2022-09-25 ENCOUNTER — Encounter: Payer: Self-pay | Admitting: Dermatology

## 2022-09-25 ENCOUNTER — Ambulatory Visit (INDEPENDENT_AMBULATORY_CARE_PROVIDER_SITE_OTHER): Payer: Federal, State, Local not specified - PPO | Admitting: Dermatology

## 2022-09-25 VITALS — BP 144/82 | HR 66

## 2022-09-25 DIAGNOSIS — D1801 Hemangioma of skin and subcutaneous tissue: Secondary | ICD-10-CM

## 2022-09-25 DIAGNOSIS — Z1283 Encounter for screening for malignant neoplasm of skin: Secondary | ICD-10-CM

## 2022-09-25 DIAGNOSIS — Z808 Family history of malignant neoplasm of other organs or systems: Secondary | ICD-10-CM

## 2022-09-25 DIAGNOSIS — D239 Other benign neoplasm of skin, unspecified: Secondary | ICD-10-CM

## 2022-09-25 DIAGNOSIS — D229 Melanocytic nevi, unspecified: Secondary | ICD-10-CM

## 2022-09-25 DIAGNOSIS — Z79899 Other long term (current) drug therapy: Secondary | ICD-10-CM | POA: Diagnosis not present

## 2022-09-25 DIAGNOSIS — D2372 Other benign neoplasm of skin of left lower limb, including hip: Secondary | ICD-10-CM

## 2022-09-25 DIAGNOSIS — L821 Other seborrheic keratosis: Secondary | ICD-10-CM

## 2022-09-25 DIAGNOSIS — W908XXA Exposure to other nonionizing radiation, initial encounter: Secondary | ICD-10-CM

## 2022-09-25 DIAGNOSIS — L409 Psoriasis, unspecified: Secondary | ICD-10-CM

## 2022-09-25 DIAGNOSIS — L814 Other melanin hyperpigmentation: Secondary | ICD-10-CM | POA: Diagnosis not present

## 2022-09-25 DIAGNOSIS — L578 Other skin changes due to chronic exposure to nonionizing radiation: Secondary | ICD-10-CM | POA: Diagnosis not present

## 2022-09-25 MED ORDER — SKYRIZI PEN 150 MG/ML ~~LOC~~ SOAJ: SUBCUTANEOUS | 1 refills | Status: DC

## 2022-09-25 MED ORDER — RISANKIZUMAB-RZAA 150 MG/ML ~~LOC~~ SOAJ
150.0000 mg | SUBCUTANEOUS | Status: AC
Start: 2022-09-25 — End: 2022-11-24
  Administered 2022-09-25: 150 mg via SUBCUTANEOUS

## 2022-09-25 NOTE — Progress Notes (Signed)
Follow-Up Visit   Subjective  Randy Dillon is a 62 y.o. male who presents for the following: Skin Cancer Screening and Full Body Skin Exam. No personal Hx of skin cancer or dysplastic nevi. Mother has had a skin cancer. Areas of concern on face.  Psoriasis at B/L hands and scalp. Taking Otezla 30 mg once daily. Tolerating well, no longer causes GI upset. Uses  Zoryve cream as directed. Not using Halobetasol foam. No hx of MS, chronic infection, CHF, malignancy, IBD.  The patient presents for Total-Body Skin Exam (TBSE) for skin cancer screening and mole check. The patient has spots, moles and lesions to be evaluated, some may be new or changing and the patient may have concern these could be cancer.    The following portions of the chart were reviewed this encounter and updated as appropriate: medications, allergies, medical history  Review of Systems:  No other skin or systemic complaints except as noted in HPI or Assessment and Plan.  Objective  Well appearing patient in no apparent distress; mood and affect are within normal limits.  A full examination was performed including scalp, head, eyes, ears, nose, lips, neck, chest, axillae, abdomen, back, buttocks, bilateral upper extremities, bilateral lower extremities, hands, feet, fingers, toes, fingernails, and toenails. All findings within normal limits unless otherwise noted below.   Relevant physical exam findings are noted in the Assessment and Plan.    Assessment & Plan   FAMILY HISTORY OF SKIN CANCER What type(s): Unsure Who affected:Mother, face   SKIN CANCER SCREENING PERFORMED TODAY.  ACTINIC DAMAGE - Chronic condition, secondary to cumulative UV/sun exposure - diffuse scaly erythematous macules with underlying dyspigmentation - Recommend daily broad spectrum sunscreen SPF 30+ to sun-exposed areas, reapply every 2 hours as needed.  - Staying in the shade or wearing long sleeves, sun glasses (UVA+UVB protection) and  wide brim hats (4-inch brim around the entire circumference of the hat) are also recommended for sun protection.  - Call for new or changing lesions.  LENTIGINES, SEBORRHEIC KERATOSES, HEMANGIOMAS - Benign normal skin lesions - Benign-appearing - Call for any changes  MELANOCYTIC NEVI - Tan-brown and/or pink-flesh-colored symmetric macules and papules - Benign appearing on exam today - Observation - Call clinic for new or changing moles - Recommend daily use of broad spectrum spf 30+ sunscreen to sun-exposed areas.   DERMATOFIBROMA Exam: Firm pink/brown papulenodule with dimple sign at left anterior leg below knee. Treatment Plan: A dermatofibroma is a benign growth possibly related to trauma, such as an insect bite, cut from shaving, or inflamed acne-type bump.  Treatment options to remove include shave or excision with resulting scar and risk of recurrence.  Since benign-appearing and not bothersome, will observe for now.    PSORIASIS on Systemic Treatment Exam:  Erythematous scaly plaques at periauricular scalp, bilaterally, extends to occipital and vertex scalp, left extensor elbow, left dorsal hand 3% BSA.   Chronic and persistent condition with duration or expected duration over one year. Condition is improving with treatment but not currently at goal.   Patient does have osteoarthritis, is followed by rheumatologist, Dr. Allena Katz.    Denies personal or family H/O IBS, MS, heart disease (father), chronic infections  Psoriasis - severe on systemic treatment.  Psoriasis is a chronic non-curable, but treatable genetic/hereditary disease that may have other systemic features affecting other organ systems such as joints (Psoriatic Arthritis).  It is linked with heart disease, inflammatory bowel disease, non-alcoholic fatty liver disease, and depression. Significant skin psoriasis  and/or psoriatic arthritis may have significant symptoms and affects activities of daily activity and often  benefits from systemic treatments.  These systemic treatments have some potential side effects including immunosuppression and require pre-treatment laboratory screening and periodic laboratory monitoring and periodic in person evaluation and monitoring by the attending dermatologist physician (long term medication management).   Reviewed risks of biologics including immunosuppression, infections, injection site reaction, and failure to improve condition. Goal is control of skin condition, not cure.  Some older biologics such as Humira and Enbrel may slightly increase risk of malignancy and may worsen congestive heart failure.  Taltz and Cosentyx may cause inflammatory bowel disease to flare. The use of biologics requires long term medication management, including periodic office visits and monitoring of blood work.   Treatment Plan: Continue Zoryve cream as directed to hands and scalp. Stop Otezla.   Labs ordered today. Patient will get them drawn today  Start Skyrizi 150 mg injection. Initial dose given today. Return in 4 weeks for 2nd loading dose. Will submit to insurance for maintenance dosing.  Skyrizi 150 mg Pen. Injected into L thigh. Patient tolerated well.  NDC: 8295-6213-08 Lot: [id number] Exp: 10/2023  Reviewed risks of biologics including immunosuppression, infections, injection site reaction, and failure to improve condition. Goal is control of skin condition, not cure.  Some older biologics such as Humira and Enbrel may slightly increase risk of malignancy and may worsen congestive heart failure.  Taltz and Cosentyx may cause inflammatory bowel disease to flare. The use of biologics requires long term medication management, including periodic office visits and monitoring of blood work.    Psoriasis  Related Procedures Comprehensive metabolic panel CBC with Differential/Platelet Hepatitis B surface antigen Hepatitis B surface antibody,qualitative Hepatitis C antibody HIV  Antibody (routine testing w rflx) Hepatitis B core antibody, total QuantiFERON-TB Gold Plus  Related Medications Halobetasol Propionate (LEXETTE) 0.05 % FOAM APPLY TOPICALLY TO THE AFFECTED AREA TWICE DAILY AS NEEDED ITCHING up to two weeks. AVOID FACE, GROIN, underarms]  Roflumilast (ZORYVE) 0.3 % CREA Use once daily to affected areas of psoriasis.  risankizumab-rzaa (SKYRIZI) pen 150 mg   Multiple benign nevi  Lentigines  Actinic elastosis  Seborrheic keratoses  Cherry angioma  Encounter for long-term (current) use of medications  Related Procedures Comprehensive metabolic panel CBC with Differential/Platelet Hepatitis B surface antigen Hepatitis B surface antibody,qualitative Hepatitis C antibody HIV Antibody (routine testing w rflx) Hepatitis B core antibody, total QuantiFERON-TB Gold Plus   Return in about 4 months (around 01/25/2023) for Psoriasis/Skyrizi Follow Up;  1 month nurse visit Skyrizi inj., 1 year TBSE.  I, Lawson Radar, CMA, am acting as scribe for Elie Goody, MD.   Documentation: I have reviewed the above documentation for accuracy and completeness, and I agree with the above.  Elie Goody, MD

## 2022-09-25 NOTE — Patient Instructions (Addendum)
Continue Zoryve cream as directed to hands and scalp  Stop Otezla.  Start Skyrizi injections. Loading dose given today, repeat in 1 month, then inject every 3 months.   Reviewed risks of biologics including immunosuppression, infections, injection site reaction, and failure to improve condition. Goal is control of skin condition, not cure.  Some older biologics such as Humira and Enbrel may slightly increase risk of malignancy and may worsen congestive heart failure.  Taltz and Cosentyx may cause inflammatory bowel disease to flare. The use of biologics requires long term medication management, including periodic office visits and monitoring of blood work.   Recommend daily broad spectrum sunscreen SPF 30+ to sun-exposed areas, reapply every 2 hours as needed. Call for new or changing lesions.  Staying in the shade or wearing long sleeves, sun glasses (UVA+UVB protection) and wide brim hats (4-inch brim around the entire circumference of the hat) are also recommended for sun protection.    Melanoma ABCDEs  Melanoma is the most dangerous type of skin cancer, and is the leading cause of death from skin disease.  You are more likely to develop melanoma if you: Have light-colored skin, light-colored eyes, or red or blond hair Spend a lot of time in the sun Tan regularly, either outdoors or in a tanning bed Have had blistering sunburns, especially during childhood Have a close family member who has had a melanoma Have atypical moles or large birthmarks  Early detection of melanoma is key since treatment is typically straightforward and cure rates are extremely high if we catch it early.   The first sign of melanoma is often a change in a mole or a new dark spot.  The ABCDE system is a way of remembering the signs of melanoma.  A for asymmetry:  The two halves do not match. B for border:  The edges of the growth are irregular. C for color:  A mixture of colors are present instead of an even  brown color. D for diameter:  Melanomas are usually (but not always) greater than 6mm - the size of a pencil eraser. E for evolution:  The spot keeps changing in size, shape, and color.  Please check your skin once per month between visits. You can use a small mirror in front and a large mirror behind you to keep an eye on the back side or your body.   If you see any new or changing lesions before your next follow-up, please call to schedule a visit.  Please continue daily skin protection including broad spectrum sunscreen SPF 30+ to sun-exposed areas, reapplying every 2 hours as needed when you're outdoors.   Staying in the shade or wearing long sleeves, sun glasses (UVA+UVB protection) and wide brim hats (4-inch brim around the entire circumference of the hat) are also recommended for sun protection.    Due to recent changes in healthcare laws, you may see results of your pathology and/or laboratory studies on MyChart before the doctors have had a chance to review them. We understand that in some cases there may be results that are confusing or concerning to you. Please understand that not all results are received at the same time and often the doctors may need to interpret multiple results in order to provide you with the best plan of care or course of treatment. Therefore, we ask that you please give [place] 2 business days to thoroughly review all your results before contacting the office for clarification. Should we see a critical lab result,  you will be contacted sooner.   If You Need Anything After Your Visit  If you have any questions or concerns for your doctor, please call our main line at [phone] and press option 4 to reach your doctor's medical assistant. If no one answers, please leave a voicemail as directed and we will return your call as soon as possible. Messages left after 4 pm will be answered the following business day.   You may also send [place] a message via MyChart. We  typically respond to MyChart messages within 1-2 business days.  For prescription refills, please ask your pharmacy to contact our office. Our fax number is [phone].  If you have an urgent issue when the clinic is closed that cannot wait until the next business day, you can page your doctor at the number below.    Please note that while we do our best to be available for urgent issues outside of office hours, we are not available 24/7.   If you have an urgent issue and are unable to reach [place], you may choose to seek medical care at your doctor's office, retail clinic, urgent care center, or emergency room.  If you have a medical emergency, please immediately call 911 or go to the emergency department.  Pager Numbers  - Dr. Gwen Pounds: [phone]  - Dr. Roseanne Reno: [phone]  - Dr. Katrinka Blazing: [phone]   In the event of inclement weather, please call our main line at [phone] for an update on the status of any delays or closures.  Dermatology Medication Tips: Please keep the boxes that topical medications come in in order to help keep track of the instructions about where and how to use these. Pharmacies typically print the medication instructions only on the boxes and not directly on the medication tubes.   If your medication is too expensive, please contact our office at [phone] option 4 or send [place] a message through MyChart.   We are unable to tell what your co-pay for medications will be in advance as this is different depending on your insurance coverage. However, we may be able to find a substitute medication at lower cost or fill out paperwork to get insurance to cover a needed medication.   If a prior authorization is required to get your medication covered by your insurance company, please allow [place] 1-2 business days to complete this process.  Drug prices often vary depending on where the prescription is filled and some pharmacies may offer cheaper prices.  The  website www.goodrx.com contains coupons for medications through different pharmacies. The prices here do not account for what the cost may be with help from insurance (it may be cheaper with your insurance), but the website can give you the price if you did not use any insurance.  - You can print the associated coupon and take it with your prescription to the pharmacy.  - You may also stop by our office during regular business hours and pick up a GoodRx coupon card.  - If you need your prescription sent electronically to a different pharmacy, notify our office through [facility] or by phone at [phone] option 4.     Si Usted Necesita Algo Despus de Su Visita  Tambin puede enviarnos un mensaje a travs de Clinical cytogeneticist. Por lo general respondemos a los mensajes de MyChart en el transcurso de 1 a 2 das hbiles.  Para renovar recetas, por favor pida a su farmacia que se ponga en contacto con nuestra oficina. [place]  [employer] [phone].  Si tiene un asunto urgente cuando la clnica est cerrada y que no puede esperar hasta el siguiente da hbil, puede llamar/localizar a su doctor(a) al nmero que aparece a continuacin.   Por favor, tenga en cuenta que aunque hacemos todo lo posible para estar disponibles para asuntos urgentes fuera del horario de [place], no estamos disponibles las 24 horas del da, los 7 [address]  [address] de la [place].   Si tiene un problema urgente y no puede comunicarse con nosotros, puede optar por buscar atencin mdica  en el consultorio de su doctor(a), en una clnica privada, en un centro de atencin urgente o en una sala de emergencias.  Si tiene Engineer, drilling, por favor llame inmediatamente al 911 o vaya a la sala de emergencias.  Nmeros de bper  - Dr. Gwen Pounds: [phone]  - Dra. Roseanne Reno: [phone]  - Dr. Katrinka Blazing: [phone]   En caso de inclemencias del tiempo, por favor llame a Lacy Duverney principal al [phone] para una  actualizacin sobre el [place] de cualquier retraso o cierre.  Consejos para la medicacin en dermatologa: Por favor, guarde las cajas en las que vienen los medicamentos de uso tpico para ayudarle a seguir las instrucciones sobre dnde y cmo usarlos. Las farmacias generalmente imprimen las instrucciones del medicamento slo en las cajas y no directamente en los tubos del [place].   Si su medicamento es muy caro, por favor, pngase en contacto con Rolm Gala llamando al [phone] y presione la opcin 4 o envenos un mensaje a travs de Clinical cytogeneticist.   No podemos decirle cul ser su copago por los medicamentos por adelantado ya que esto es diferente dependiendo de la cobertura de su seguro. Sin embargo, es posible que podamos encontrar un medicamento sustituto a Audiological scientist un formulario para que el seguro cubra el medicamento que se considera necesario.   Si se requiere una autorizacin previa para que su compaa de seguros [place] su medicamento, por favor permtanos de 1 a 2 das hbiles para completar [address]  Los precios de los medicamentos varan con frecuencia dependiendo del Environmental consultant de dnde se surte la receta y alguna farmacias pueden ofrecer precios ms baratos.  El sitio web www.goodrx.com tiene cupones para medicamentos de Health and safety inspector. Los precios aqu no tienen en cuenta lo que podra costar con la ayuda del seguro (puede ser ms barato con su seguro), pero el sitio web puede darle el precio si no utiliz Tourist information centre manager.  - Puede imprimir el cupn correspondiente y llevarlo con su receta a la farmacia.  - Tambin puede pasar por nuestra oficina durante el horario de atencin regular y Education officer, museum una tarjeta de cupones de GoodRx.  - Si necesita que su receta se enve electrnicamente a una farmacia diferente, informe a nuestra oficina a travs de MyChart de [place] o por telfono llamando al [phone] y presione la opcin 4.

## 2022-09-27 LAB — CBC WITH DIFFERENTIAL/PLATELET
Basophils Absolute: 0 10*3/uL (ref 0.0–0.2)
Basos: 1 %
EOS (ABSOLUTE): 0.2 10*3/uL (ref 0.0–0.4)
Eos: 3 %
Hematocrit: 46.6 % (ref 37.5–51.0)
Hemoglobin: 16.3 g/dL (ref 13.0–17.7)
Immature Grans (Abs): 0 10*3/uL (ref 0.0–0.1)
Immature Granulocytes: 0 %
Lymphocytes Absolute: 1.2 10*3/uL (ref 0.7–3.1)
Lymphs: 21 %
MCH: 29.9 pg (ref 26.6–33.0)
MCHC: 35 g/dL (ref 31.5–35.7)
MCV: 85 fL (ref 79–97)
Monocytes Absolute: 0.6 10*3/uL (ref 0.1–0.9)
Monocytes: 10 %
Neutrophils Absolute: 3.5 10*3/uL (ref 1.4–7.0)
Neutrophils: 65 %
Platelets: 174 10*3/uL (ref 150–450)
RBC: 5.46 x10E6/uL (ref 4.14–5.80)
RDW: 13.2 % (ref 11.6–15.4)
WBC: 5.4 10*3/uL (ref 3.4–10.8)

## 2022-09-27 LAB — COMPREHENSIVE METABOLIC PANEL
ALT: 31 IU/L (ref 0–44)
AST: 32 IU/L (ref 0–40)
Albumin: 4.6 g/dL (ref 3.8–4.9)
Alkaline Phosphatase: 102 IU/L (ref 44–121)
BUN/Creatinine Ratio: 13 (ref 10–24)
BUN: 13 mg/dL (ref 8–27)
Bilirubin Total: 0.8 mg/dL (ref 0.0–1.2)
CO2: 22 mmol/L (ref 20–29)
Calcium: 9.9 mg/dL (ref 8.6–10.2)
Chloride: 100 mmol/L (ref 96–106)
Creatinine, Ser: 0.99 mg/dL (ref 0.76–1.27)
Globulin, Total: 2.8 g/dL (ref 1.5–4.5)
Glucose: 104 mg/dL — ABNORMAL HIGH (ref 70–99)
Potassium: 4.4 mmol/L (ref 3.5–5.2)
Sodium: 140 mmol/L (ref 134–144)
Total Protein: 7.4 g/dL (ref 6.0–8.5)
eGFR: 87 mL/min/{1.73_m2} (ref >59)

## 2022-09-27 LAB — HEPATITIS C ANTIBODY: Hep C Virus Ab: NONREACTIVE

## 2022-09-27 LAB — QUANTIFERON-TB GOLD PLUS
QuantiFERON Mitogen Value: 7.56 [IU]/mL
QuantiFERON Nil Value: 0.04 [IU]/mL
QuantiFERON TB1 Ag Value: 0.04 [IU]/mL
QuantiFERON TB2 Ag Value: 0.04 [IU]/mL
QuantiFERON-TB Gold Plus: NEGATIVE

## 2022-09-27 LAB — HEPATITIS B SURFACE ANTIGEN: Hepatitis B Surface Ag: NEGATIVE

## 2022-09-27 LAB — HIV ANTIBODY (ROUTINE TESTING W REFLEX): HIV Screen 4th Generation wRfx: NONREACTIVE

## 2022-09-27 LAB — HEPATITIS B SURFACE ANTIBODY,QUALITATIVE: Hep B Surface Ab, Qual: NONREACTIVE

## 2022-10-23 ENCOUNTER — Ambulatory Visit: Payer: Federal, State, Local not specified - PPO

## 2022-10-24 ENCOUNTER — Telehealth: Payer: Self-pay

## 2022-10-24 ENCOUNTER — Ambulatory Visit: Payer: Federal, State, Local not specified - PPO

## 2022-10-24 NOTE — Telephone Encounter (Signed)
Patient came in today for second loading dose injection of Skyrizi 150mg . Patient expressed interest in doing the injection himself. Patient was educated on the procedure and injection was administered into patients right abdomen. He tolerated the procedure well and all questions and concerns were answered. Patient is currently in appeals with insurance regarding his Cristy Folks and next scheduled apt is in January 2025.   [id number] Exp:01/2023

## 2022-11-11 ENCOUNTER — Telehealth: Payer: Self-pay

## 2022-11-11 NOTE — Telephone Encounter (Signed)
Patient calling he was here a few weeks ago and signed an appeal for [facility], patient would like to know have that form been sent because he was told the appeal have not received from this office

## 2022-11-11 NOTE — Telephone Encounter (Signed)
Just received notification this afternoon Skyrizi approved.

## 2023-01-30 ENCOUNTER — Encounter: Payer: Self-pay | Admitting: Dermatology

## 2023-01-30 ENCOUNTER — Ambulatory Visit: Payer: BC Managed Care – PPO | Admitting: Dermatology

## 2023-01-30 DIAGNOSIS — W57XXXA Bitten or stung by nonvenomous insect and other nonvenomous arthropods, initial encounter: Secondary | ICD-10-CM | POA: Diagnosis not present

## 2023-01-30 DIAGNOSIS — T8089XA Other complications following infusion, transfusion and therapeutic injection, initial encounter: Secondary | ICD-10-CM | POA: Diagnosis not present

## 2023-01-30 DIAGNOSIS — Z79899 Other long term (current) drug therapy: Secondary | ICD-10-CM

## 2023-01-30 DIAGNOSIS — Z7189 Other specified counseling: Secondary | ICD-10-CM

## 2023-01-30 DIAGNOSIS — L409 Psoriasis, unspecified: Secondary | ICD-10-CM

## 2023-01-30 DIAGNOSIS — S70362A Insect bite (nonvenomous), left thigh, initial encounter: Secondary | ICD-10-CM | POA: Diagnosis not present

## 2023-01-30 MED ORDER — SKYRIZI PEN 150 MG/ML ~~LOC~~ SOAJ
SUBCUTANEOUS | 1 refills | Status: DC
Start: 2023-01-30 — End: 2023-04-17

## 2023-01-30 MED ORDER — CLOBETASOL PROPIONATE 0.05 % EX CREA
TOPICAL_CREAM | CUTANEOUS | 0 refills | Status: AC
Start: 2023-01-30 — End: ?

## 2023-01-30 NOTE — Patient Instructions (Addendum)
 Send mychart if unable to get clobetasol  cream for rash  Apply clobetasol  0.05 % cream twice daily to itchy areas twice daily on spot at hands until smooth. Apply twice daily to tick bite at left thigh for 2 weeks if not helping stop cream  Avoid applying to face, groin, and axilla. Use as directed. Long-term use can cause thinning of the skin.  Topical steroids (such as triamcinolone , fluocinolone, fluocinonide, mometasone, clobetasol , halobetasol , betamethasone, hydrocortisone) can cause thinning and lightening of the skin if they are used for too long in the same area. Your physician has selected the right strength medicine for your problem and area affected on the body. Please use your medication only as directed by your physician to prevent side effects.   Recommend OTC Gold Bond Rapid Relief Anti-Itch cream (pramoxine + menthol), CeraVe Anti-itch cream or lotion (pramoxine), Sarna lotion (Original- menthol + camphor or Sensitive- pramoxine) or Eucerin 12 hour Itch Relief lotion (menthol) up to 3 times per day to areas on body that are itchy.     Due to recent changes in healthcare laws, you may see results of your pathology and/or laboratory studies on MyChart before the doctors have had a chance to review them. We understand that in some cases there may be results that are confusing or concerning to you. Please understand that not all results are received at the same time and often the doctors may need to interpret multiple results in order to provide you with the best plan of care or course of treatment. Therefore, we ask that you please give us  2 business days to thoroughly review all your results before contacting the office for clarification. Should we see a critical lab result, you will be contacted sooner.   If You Need Anything After Your Visit  If you have any questions or concerns for your doctor, please call our main line at [phone] and press option 4 to reach your doctor's  medical assistant. If no one answers, please leave a voicemail as directed and we will return your call as soon as possible. Messages left after 4 pm will be answered the following business day.   You may also send us  a message via MyChart. We typically respond to MyChart messages within 1-2 business days.  For prescription refills, please ask your pharmacy to contact our office. Our fax number is [phone].  If you have an urgent issue when the clinic is closed that cannot wait until the next business day, you can page your doctor at the number below.    Please note that while we do our best to be available for urgent issues outside of office hours, we are not available 24/7.   If you have an urgent issue and are unable to reach us , you may choose to seek medical care at your doctor's office, retail clinic, urgent care center, or emergency room.  If you have a medical emergency, please immediately call 911 or go to the emergency department.  Pager Numbers  - Dr. Hester: [phone]  - Dr. Jackquline: [phone]  - Dr. Claudene: [phone]   In the event of inclement weather, please call our main line at [phone] for an update on the status of any delays or closures.  Dermatology Medication Tips: Please keep the boxes that topical medications come in in order to help keep track of the instructions about where and how to use these. Pharmacies typically print the medication instructions only on the boxes and not directly on the  medication tubes.   If your medication is too expensive, please contact our office at [phone] option 4 or send us  a message through MyChart.   We are unable to tell what your co-pay for medications will be in advance as this is different depending on your insurance coverage. However, we may be able to find a substitute medication at lower cost or fill out paperwork to get insurance to cover a needed medication.   If a prior authorization is required  to get your medication covered by your insurance company, please allow us  1-2 business days to complete this process.  Drug prices often vary depending on where the prescription is filled and some pharmacies may offer cheaper prices.  The website www.goodrx.com contains coupons for medications through different pharmacies. The prices here do not account for what the cost may be with help from insurance (it may be cheaper with your insurance), but the website can give you the price if you did not use any insurance.  - You can print the associated coupon and take it with your prescription to the pharmacy.  - You may also stop by our office during regular business hours and pick up a GoodRx coupon card.  - If you need your prescription sent electronically to a different pharmacy, notify our office through [facility] or by phone at [phone] option 4.     Si Usted Necesita Algo Despus de Su Visita  Tambin puede enviarnos un mensaje a travs de Clinical Cytogeneticist. Por lo general respondemos a los mensajes de MyChart en el transcurso de 1 a 2 das hbiles.  Para renovar recetas, por favor pida a su farmacia que se ponga en contacto con nuestra oficina. Randi lakes de fax es [place] [phone].  Si tiene un asunto urgente cuando la clnica est cerrada y que no puede esperar hasta el siguiente da hbil, puede llamar/localizar a su doctor(a) al nmero que aparece a continuacin.   Por favor, tenga en cuenta que aunque hacemos todo lo posible para estar disponibles para asuntos urgentes fuera del horario de [place], no estamos disponibles las 24 horas del da, los 7 [address]  [address] de la [place].   Si tiene un problema urgente y no puede comunicarse con nosotros, puede optar por buscar atencin mdica  en el consultorio de su doctor(a), en una clnica privada, en un centro de atencin urgente o en una sala de emergencias.  Si tiene engineer, drilling, por favor llame inmediatamente al 911 o vaya a la sala  de emergencias.  Nmeros de bper  - Dr. Hester: [phone]  - Dra. Jackquline: [phone]  - Dr. Claudene: [phone]   En caso de inclemencias del tiempo, por favor llame a landry capes principal al [phone] para una actualizacin sobre el [place] de cualquier retraso o cierre.  Consejos para la medicacin en dermatologa: Por favor, guarde las cajas en las que vienen los medicamentos de uso tpico para ayudarle a seguir las instrucciones sobre dnde y cmo usarlos. Las farmacias generalmente imprimen las instrucciones del medicamento slo en las cajas y no directamente en los tubos del [place].   Si su medicamento es muy caro, por favor, pngase en contacto con landry rieger llamando al [phone] y presione la opcin 4 o envenos un mensaje a travs de Clinical Cytogeneticist.   No podemos decirle cul ser su copago por los medicamentos por adelantado ya que esto es diferente dependiendo de la cobertura de su seguro. Sin embargo, es posible que podamos encontrar un medicamento sustituto a  menor costo o llenar un formulario para que el seguro cubra el medicamento que se considera necesario.   Si se requiere una autorizacin previa para que su compaa de seguros cubra su medicamento, por favor permtanos de 1 a 2 das hbiles para completar este proceso.  Los precios de los medicamentos varan con frecuencia dependiendo del environmental consultant de dnde se surte la receta y alguna farmacias pueden ofrecer precios ms baratos.  El sitio web www.goodrx.com tiene cupones para medicamentos de health and safety inspector. Los precios aqu no tienen en cuenta lo que podra costar con la ayuda del seguro (puede ser ms barato con su seguro), pero el sitio web puede darle el precio si no utiliz tourist information centre manager.  - Puede imprimir el cupn correspondiente y llevarlo con su receta a la farmacia.  - Tambin puede pasar por nuestra oficina durante el horario de atencin regular y education officer, museum una tarjeta de cupones de GoodRx.  -  Si necesita que su receta se enve electrnicamente a una farmacia diferente, informe a nuestra oficina a travs de MyChart de [place] o por telfono llamando al [phone] y presione la opcin 4.

## 2023-01-30 NOTE — Progress Notes (Signed)
 Follow-Up Visit   Subjective  Randy Dillon is a 63 y.o. male who presents for the following: Psoriasis 4 month follow up on skyrizi  doing well still some active areas on hands. History of flares at scalp and hands. Also using Zoryve  cream to affected areas when needed.   Patient also reports a itchy area at right medial thigh from a tick bite back in June. He would like area checked today.  Itchy area at left upper arm from history of covid shot in 2021   The following portions of the chart were reviewed this encounter and updated as appropriate: medications, allergies, medical history  Review of Systems:  No other skin or systemic complaints except as noted in HPI or Assessment and Plan.  Objective  Well appearing patient in no apparent distress; mood and affect are within normal limits.  Areas Examined: B/l hands, left arm, left inner thigh   Relevant exam findings are noted in the Assessment and Plan.      Assessment & Plan   PSORIASIS   Related Medications Halobetasol  Propionate (LEXETTE ) 0.05 % FOAM APPLY TOPICALLY TO THE AFFECTED AREA TWICE DAILY AS NEEDED ITCHING up to two weeks. AVOID FACE, GROIN, underarms] Roflumilast  (ZORYVE ) 0.3 % CREA Use once daily to affected areas of psoriasis. clobetasol  cream (TEMOVATE ) 0.05 % Apply to itchy areas twice daily as needed for rash. Avoid applying to face, groin, and axilla. Use as directed. Long-term use can cause thinning of the skin. risankizumab -rzaa (SKYRIZI  PEN) 150 MG/ML pen Inject contents of 1 pen subcutaneously every 3 months LONG-TERM USE OF HIGH-RISK MEDICATION   COUNSELING AND COORDINATION OF CARE   TICK BITE OF LEFT THIGH, INITIAL ENCOUNTER   Related Medications clobetasol  cream (TEMOVATE ) 0.05 % Apply to itchy areas twice daily as needed for rash. Avoid applying to face, groin, and axilla. Use as directed. Long-term use can cause thinning of the skin.  PSORIASIS on systemic treatment with prior  scalp involvement, significantly improved on Skyrizi  Well-demarcated erythematous papules/plaques with silvery scale on left dorsal MCP 2 and 4. 1% BSA.  Chronic and persistent condition with duration or expected duration over one year. Condition is improving with treatment but not currently at goal. On Skyrizi     Counseling and coordination of care for severe psoriasis on systemic treatment  Psoriasis - severe on systemic treatment.  Psoriasis is a chronic non-curable, but treatable genetic/hereditary disease that may have other systemic features affecting other organ systems such as joints (Psoriatic Arthritis).  It is linked with heart disease, inflammatory bowel disease, non-alcoholic fatty liver disease, and depression. Significant skin psoriasis and/or psoriatic arthritis may have significant symptoms and affects activities of daily activity and often benefits from systemic treatments.  These systemic treatments have some potential side effects including immunosuppression and require pre-treatment laboratory screening and periodic laboratory monitoring and periodic in person evaluation and monitoring by the attending dermatologist physician (long term medication management).   Denies personal or family H/O IBS, MS, heart disease (father), chronic infections  Treatment Plan: 09/25/22 quantiferon gold negative Continue Zoryve  cream as directed to hands and scalp.   Continue Skyrizi  150 mg q 3 months. Injections so far were on: 9/4 10/3 12/27  Reviewed risks of biologics including immunosuppression, infections, injection site reaction, and failure to improve condition. Goal is control of skin condition, not cure.  Some older biologics such as Humira and Enbrel may slightly increase risk of malignancy and may worsen congestive heart failure.  Taltz and Cosentyx may cause  inflammatory bowel disease to flare. The use of biologics requires long term medication management, including periodic office  visits and monitoring of blood work.   Long term medication management.  Patient is using long term (months to years) prescription medication  to control their dermatologic condition.  These medications require periodic monitoring to evaluate for efficacy and side effects and may require periodic laboratory monitoring.    Bite reaction from tick  Exam: circular pink plaque with focal scale and induration at left medial thigh   Treatment Plan: Hx of tick bite in June - reports still itchy  No scale   Clobetasol  cream - apply topically twice daily to affected area as needed for itch for 2 weeks   Topical steroids (such as triamcinolone , fluocinolone, fluocinonide, mometasone, clobetasol , halobetasol , betamethasone, hydrocortisone) can cause thinning and lightening of the skin if they are used for too long in the same area. Your physician has selected the right strength medicine for your problem and area affected on the body. Please use your medication only as directed by your physician to prevent side effects.   If not resolved by cream will biopsy or consider steriod injection   History of Covid vaccine injection site reaction at left upper arm  Exam: no focal lesions  Treatment Plan: Had covid vaccine in 2021 - report injection site is still itchy  Try OTC anti itch creams as needed. Gave options such as cerave itch relief and sarna   Return in about 8 months (around 09/30/2023) for psoriasis follow up.  I, Eleanor Blush, CMA, am acting as scribe for Boneta Sharps, MD.   Documentation: I have reviewed the above documentation for accuracy and completeness, and I agree with the above.  Boneta Sharps, MD

## 2023-02-12 DIAGNOSIS — M19041 Primary osteoarthritis, right hand: Secondary | ICD-10-CM | POA: Diagnosis not present

## 2023-02-12 DIAGNOSIS — M19042 Primary osteoarthritis, left hand: Secondary | ICD-10-CM | POA: Diagnosis not present

## 2023-02-12 DIAGNOSIS — L409 Psoriasis, unspecified: Secondary | ICD-10-CM | POA: Diagnosis not present

## 2023-02-12 DIAGNOSIS — M25542 Pain in joints of left hand: Secondary | ICD-10-CM | POA: Diagnosis not present

## 2023-02-12 DIAGNOSIS — M1812 Unilateral primary osteoarthritis of first carpometacarpal joint, left hand: Secondary | ICD-10-CM | POA: Diagnosis not present

## 2023-02-12 DIAGNOSIS — M545 Low back pain, unspecified: Secondary | ICD-10-CM | POA: Diagnosis not present

## 2023-03-11 ENCOUNTER — Ambulatory Visit (INDEPENDENT_AMBULATORY_CARE_PROVIDER_SITE_OTHER): Payer: Federal, State, Local not specified - PPO | Admitting: Internal Medicine

## 2023-03-11 ENCOUNTER — Encounter: Payer: Self-pay | Admitting: Internal Medicine

## 2023-03-11 VITALS — BP 132/84 | HR 67 | Temp 97.6°F | Ht 72.5 in | Wt 213.0 lb

## 2023-03-11 DIAGNOSIS — L570 Actinic keratosis: Secondary | ICD-10-CM | POA: Diagnosis not present

## 2023-03-11 DIAGNOSIS — L409 Psoriasis, unspecified: Secondary | ICD-10-CM

## 2023-03-11 DIAGNOSIS — I10 Essential (primary) hypertension: Secondary | ICD-10-CM

## 2023-03-11 DIAGNOSIS — Z125 Encounter for screening for malignant neoplasm of prostate: Secondary | ICD-10-CM

## 2023-03-11 DIAGNOSIS — Z0000 Encounter for general adult medical examination without abnormal findings: Secondary | ICD-10-CM | POA: Diagnosis not present

## 2023-03-11 DIAGNOSIS — E785 Hyperlipidemia, unspecified: Secondary | ICD-10-CM

## 2023-03-11 LAB — COMPREHENSIVE METABOLIC PANEL
ALT: 33 U/L (ref 0–53)
AST: 25 U/L (ref 0–37)
Albumin: 4.6 g/dL (ref 3.5–5.2)
Alkaline Phosphatase: 67 U/L (ref 39–117)
BUN: 15 mg/dL (ref 6–23)
CO2: 30 meq/L (ref 19–32)
Calcium: 9.4 mg/dL (ref 8.4–10.5)
Chloride: 100 meq/L (ref 96–112)
Creatinine, Ser: 0.94 mg/dL (ref 0.40–1.50)
GFR: 87.99 mL/min (ref >60.00)
Glucose, Bld: 100 mg/dL — ABNORMAL HIGH (ref 70–99)
Potassium: 4.5 meq/L (ref 3.5–5.1)
Sodium: 138 meq/L (ref 135–145)
Total Bilirubin: 0.9 mg/dL (ref 0.2–1.2)
Total Protein: 7.2 g/dL (ref 6.0–8.3)

## 2023-03-11 LAB — CBC
HCT: 44.8 % (ref 39.0–52.0)
Hemoglobin: 15.4 g/dL (ref 13.0–17.0)
MCHC: 34.3 g/dL (ref 30.0–36.0)
MCV: 88.7 fL (ref 78.0–100.0)
Platelets: 155 10*3/uL (ref 150.0–400.0)
RBC: 5.05 Mil/uL (ref 4.22–5.81)
RDW: 13.5 % (ref 11.5–15.5)
WBC: 5.5 10*3/uL (ref 4.0–10.5)

## 2023-03-11 LAB — LIPID PANEL
Cholesterol: 262 mg/dL — ABNORMAL HIGH (ref 0–200)
HDL: 66.3 mg/dL (ref >39.00)
LDL Cholesterol: 181 mg/dL — ABNORMAL HIGH (ref 0–99)
NonHDL: 195.73
Total CHOL/HDL Ratio: 4
Triglycerides: 74 mg/dL (ref 0.0–149.0)
VLDL: 14.8 mg/dL (ref 0.0–40.0)

## 2023-03-11 LAB — PSA: PSA: 1.02 ng/mL (ref 0.10–4.00)

## 2023-03-11 NOTE — Assessment & Plan Note (Signed)
 Discussed options--he gave verbal consent Liquid nitrogen 30 seconds x 2 to lesion Tolerated well Discussed home care

## 2023-03-11 NOTE — Assessment & Plan Note (Signed)
 Healthy Discussed increasing exercise Probably too late for flu vaccine now---urged him to get it in September Prefers no COVID Check PSA Colon due again 2027

## 2023-03-11 NOTE — Addendum Note (Signed)
 Addended by: Tillman Abide I on: 03/11/2023 10:32 AM   Modules accepted: Level of Service

## 2023-03-11 NOTE — Progress Notes (Signed)
 Subjective:    Patient ID: Randy Dillon, male    DOB: 09/01/1960, 63 y.o.   MRN: [id number]  HPI Here for physical  No new concerns Now on skyrizi for psoriasis Working well  Still sees Dr Allena Katz ---for bad OA Cortisone shots in wrist/hands---left most recently  Current Outpatient Medications on File Prior to Visit  Medication Sig Dispense Refill   cetirizine (ZYRTEC) 10 MG tablet Take 10 mg by mouth daily.     clobetasol cream (TEMOVATE) 0.05 % Apply to itchy areas twice daily as needed for rash. Avoid applying to face, groin, and axilla. Use as directed. Long-term use can cause thinning of the skin. 30 g 0   diclofenac Sodium (VOLTAREN) 1 % GEL Apply 2 g topically 4 (four) times daily.     fluticasone (FLONASE) 50 MCG/ACT nasal spray Place into both nostrils daily as needed for allergies or rhinitis.     gabapentin (NEURONTIN) 300 MG capsule ONE TAB TWICE A DAY 180 capsule 3   Halobetasol Propionate (LEXETTE) 0.05 % FOAM APPLY TOPICALLY TO THE AFFECTED AREA TWICE DAILY AS NEEDED ITCHING up to two weeks. AVOID FACE, GROIN, underarms] 50 g 11   Multiple Vitamin (MULTI-VITAMIN) tablet Take 1 tablet by mouth daily.     naproxen sodium (ALEVE) 220 MG tablet Take 220 mg by mouth.     risankizumab-rzaa (SKYRIZI PEN) 150 MG/ML pen Inject contents of 1 pen subcutaneously every 3 months 1 mL 1   Roflumilast (ZORYVE) 0.3 % CREA Use once daily to affected areas of psoriasis. 60 g 2   tadalafil (CIALIS) 20 MG tablet Take 0.5-1 tablets (10-20 mg total) by mouth every other day as needed for erectile dysfunction. 10 tablet 11   No current facility-administered medications on file prior to visit.    No Known Allergies  Past Medical History:  Diagnosis Date   Allergy    Hyperlipidemia    Hypertension    Testosterone deficiency    Dr Achilles Dunk treated in past    Past Surgical History:  Procedure Laterality Date   CYSTECTOMY  03/2014   cyst removed from lower lumbar   VASECTOMY Bilateral 2011     Family History  Problem Relation Age of Onset   Skin cancer Mother    Heart disease Father    Diabetes Father     Social History   Socioeconomic History   Marital status: Divorced    Spouse name: Not on file   Number of children: 2   Years of education: Not on file   Highest education level: Not on file  Occupational History   Occupation: Postal service    Comment: Retired 2023  Tobacco Use   Smoking status: Never    Passive exposure: Past   Smokeless tobacco: Never  Substance and Sexual Activity   Alcohol use: Yes    Alcohol/week: 2.0 standard drinks of alcohol    Types: 2 Standard drinks or equivalent per week   Drug use: No   Sexual activity: Not on file  Other Topics Concern   Not on file  Social History Narrative   Divorced   Remarried 2012--then divorced again   Social Drivers of Health   Financial Resource Strain: Not on file  Food Insecurity: Not on file  Transportation Needs: Not on file  Physical Activity: Not on file  Stress: Not on file  Social Connections: Not on file  Intimate Partner Violence: Not on file      Review of Systems  Constitutional:        Had lost some weight on keto diet--but then gained it back over the holidays Does exercise --but not as often Wear seat belt  HENT:  Positive for hearing loss and tinnitus.        Will get hearing checked at [facility] soon Failed crown --then needed tooth pulled. Insurance issues now  Eyes:  Negative for visual disturbance.       No diplopia or unilateral vision loss  Respiratory:  Negative for cough, chest tightness and shortness of breath.   Cardiovascular:  Negative for chest pain, palpitations and leg swelling.  Gastrointestinal:  Negative for constipation.       Occ blood on toilet paper---from hemorrhoid (rarely in stool also) Some heartburn---hasn't needed meds  Endocrine: Negative for polydipsia and polyuria.  Genitourinary:  Negative for difficulty urinating and urgency.       No  sexual problems  Musculoskeletal:  Positive for arthralgias. Negative for back pain and joint swelling.  Skin:        Has new spot on right elbow--forgot to show his derm Also has persistent spot on left upper thigh--from tick bite  Allergic/Immunologic: Positive for environmental allergies. Negative for immunocompromised state.       Satisfied with flonase/cetirizine Benedryl at night prn  Neurological:  Negative for dizziness, syncope, light-headedness and headaches.  Hematological:  Negative for adenopathy. Does not bruise/bleed easily.  Psychiatric/Behavioral:  Negative for dysphoric mood and sleep disturbance. The patient is not nervous/anxious.        Objective:   Physical Exam Constitutional:      Appearance: Normal appearance.  HENT:     Mouth/Throat:     Pharynx: No oropharyngeal exudate or posterior oropharyngeal erythema.  Eyes:     Conjunctiva/sclera: Conjunctivae normal.     Pupils: Pupils are equal, round, and reactive to light.  Cardiovascular:     Rate and Rhythm: Normal rate and regular rhythm.     Pulses: Normal pulses.     Heart sounds: No murmur heard.    No gallop.  Pulmonary:     Effort: Pulmonary effort is normal.     Breath sounds: Normal breath sounds. No wheezing or rales.  Abdominal:     Palpations: Abdomen is soft.     Tenderness: There is no abdominal tenderness.  Musculoskeletal:     Cervical back: Neck supple.     Right lower leg: No edema.     Left lower leg: No edema.  Lymphadenopathy:     Cervical: No cervical adenopathy.  Skin:    Findings: No lesion or rash.     Comments: Nothing on thigh now (just slight hyperpigmentation) 3-4 mm actinic on right elbow  Neurological:     General: No focal deficit present.     Mental Status: He is alert and oriented to person, place, and time.  Psychiatric:        Mood and Affect: Mood normal.        Behavior: Behavior normal.            Assessment & Plan:

## 2023-03-11 NOTE — Assessment & Plan Note (Signed)
 Doing well on the skyrizi

## 2023-03-11 NOTE — Assessment & Plan Note (Signed)
 BP Readings from Last 3 Encounters:  03/11/23 132/84  09/25/22 (!) 144/82  03/07/22 122/78   Still fine without Rx

## 2023-03-11 NOTE — Assessment & Plan Note (Signed)
 Not excited about statin but would consider

## 2023-03-12 ENCOUNTER — Encounter: Payer: Self-pay | Admitting: Internal Medicine

## 2023-04-17 ENCOUNTER — Other Ambulatory Visit: Payer: Self-pay | Admitting: Dermatology

## 2023-04-17 ENCOUNTER — Encounter: Payer: Self-pay | Admitting: Dermatology

## 2023-04-17 DIAGNOSIS — L409 Psoriasis, unspecified: Secondary | ICD-10-CM

## 2023-04-17 MED ORDER — SKYRIZI PEN 150 MG/ML ~~LOC~~ SOAJ: SUBCUTANEOUS | 1 refills | Status: DC

## 2023-04-29 DIAGNOSIS — E785 Hyperlipidemia, unspecified: Secondary | ICD-10-CM | POA: Diagnosis not present

## 2023-04-29 DIAGNOSIS — H903 Sensorineural hearing loss, bilateral: Secondary | ICD-10-CM | POA: Diagnosis not present

## 2023-04-29 DIAGNOSIS — I10 Essential (primary) hypertension: Secondary | ICD-10-CM | POA: Diagnosis not present

## 2023-04-29 DIAGNOSIS — L409 Psoriasis, unspecified: Secondary | ICD-10-CM | POA: Diagnosis not present

## 2023-04-29 DIAGNOSIS — J309 Allergic rhinitis, unspecified: Secondary | ICD-10-CM | POA: Diagnosis not present

## 2023-04-29 DIAGNOSIS — H9313 Tinnitus, bilateral: Secondary | ICD-10-CM | POA: Diagnosis not present

## 2023-07-16 ENCOUNTER — Ambulatory Visit: Payer: Self-pay

## 2023-07-16 NOTE — Telephone Encounter (Signed)
 FYI Only or Action Required?: FYI only for provider.  Patient was last seen in primary care on 03/11/2023 by Jimmy Charlie FERNS, MD. Called Nurse Triage reporting Shortness of Breath. Symptoms began several weeks ago. Interventions attempted: Nothing. Symptoms are: unchanged.  Triage Disposition: See PCP Within 2 Weeks  Patient/caregiver understands and will follow disposition?: Yes   Copied from CRM [id number]. Topic: Clinical - Red Word Triage >> Jul 16, 2023  1:11 PM Abigail D wrote: Red Word that prompted transfer to Nurse Triage: Patient has been experiencing shortness of breath, it's not constant but continues most of the day & gets worse as the day, keeps him up at night. This has been going on for about 2 weeks. No additional symptoms. Reason for Disposition  [1] MILD longstanding difficulty breathing AND [2]  SAME as normal  Answer Assessment - Initial Assessment Questions 1. RESPIRATORY STATUS: Describe your breathing? (e.g., wheezing, shortness of breath, unable to speak, severe coughing)      States that he feels like he cannot get a full breath 2. ONSET: When did this breathing problem begin?      Reports that shortness of breath has been going on for about 2.5 weeks 3. PATTERN Does the difficult breathing come and go, or has it been constant since it started?      Comes and goes, generally worsens as day progresses making it difficult for him to sleep at night.  At times spontaneously resolves 4. SEVERITY: How bad is your breathing? (e.g., mild, moderate, severe)    - MILD: No SOB at rest, mild SOB with walking, speaks normally in sentences, can lie down, no retractions, pulse < 100.    - MODERATE: SOB at rest, SOB with minimal exertion and prefers to sit, cannot lie down flat, speaks in phrases, mild retractions, audible wheezing, pulse 100-120.    - SEVERE: Very SOB at rest, speaks in single words, struggling to breathe, sitting hunched forward, retractions, pulse > 120       Shortness of breath at rest, does not worsen with activity. 5. RECURRENT SYMPTOM: Have you had difficulty breathing before? If Yes, ask: When was the last time? and What happened that time?      Yes, states that Dr. Jimmy told him it was anxiety, but did not treat 6. CARDIAC HISTORY: Do you have any history of heart disease? (e.g., heart attack, angina, bypass surgery, angioplasty)      HTN approximately 40 years ago, but has not been treated recently 7. LUNG HISTORY: Do you have any history of lung disease?  (e.g., pulmonary embolus, asthma, emphysema)     denies 8. CAUSE: What do you think is causing the breathing problem?      Possibly anxiety, but unsure 9. OTHER SYMPTOMS: Do you have any other symptoms? (e.g., dizziness, runny nose, cough, chest pain, fever)     Denies dizziness or chest pain 10. O2 SATURATION MONITOR:  Do you use an oxygen saturation monitor (pulse oximeter) at home? If Yes, ask: What is your reading (oxygen level) today? What is your usual oxygen saturation reading? (e.g., 95%)       Does not own Patient states t  Protocols used: Breathing Difficulty-A-AH

## 2023-07-16 NOTE — Telephone Encounter (Signed)
 Noted Will check him at tomorrow's visit

## 2023-07-17 ENCOUNTER — Ambulatory Visit (INDEPENDENT_AMBULATORY_CARE_PROVIDER_SITE_OTHER)
Admission: RE | Admit: 2023-07-17 | Discharge: 2023-07-17 | Disposition: A | Discharge status: Home / Self Care | Source: Ambulatory Visit | Attending: Internal Medicine | Admitting: Internal Medicine

## 2023-07-17 ENCOUNTER — Ambulatory Visit: Payer: Self-pay | Admitting: Internal Medicine

## 2023-07-17 ENCOUNTER — Encounter: Payer: Self-pay | Admitting: Internal Medicine

## 2023-07-17 ENCOUNTER — Ambulatory Visit: Admitting: Internal Medicine

## 2023-07-17 VITALS — BP 120/80 | HR 79 | Temp 98.4°F | Ht 72.5 in | Wt 219.0 lb

## 2023-07-17 DIAGNOSIS — R0602 Shortness of breath: Secondary | ICD-10-CM | POA: Insufficient documentation

## 2023-07-17 NOTE — Assessment & Plan Note (Addendum)
 Vague sense of trouble breathing--that worsens as the day goes on Not especially exertional No recent respiratory infection or symptoms No other cardiac symptoms  Will check EKG, CXR EKG --sinus at 67, PVC x 3, no ischemic changes or hypertrophy CXR shows apparent increased interstitial markings---if radiologist confirms this, will refer for pulmonary evaluation. If not, will set up with cardiology [facility])

## 2023-07-17 NOTE — Progress Notes (Signed)
 Subjective:    Patient ID: Randy Dillon, male    DOB: 20-Oct-1960, 63 y.o.   MRN: [id number]  HPI Here due to trouble breathing  Has noticed increasing DOE over the past few weeks Had PE here and then at [place] in April Started magnesium, K2 and D3 per their recommendation Stopped those but didn't make a difference  Progressive worsening as the day goes on--not so bad first in AM Hard to get a deep breath No palpitations Doesn't feel anxious No dizziness or syncope No recent exercise----but able to walk without worsening symptoms  Current Outpatient Medications on File Prior to Visit  Medication Sig Dispense Refill   cetirizine (ZYRTEC) 10 MG tablet Take 10 mg by mouth daily.     clobetasol  cream (TEMOVATE ) 0.05 % Apply to itchy areas twice daily as needed for rash. Avoid applying to face, groin, and axilla. Use as directed. Long-term use can cause thinning of the skin. 30 g 0   diclofenac Sodium (VOLTAREN) 1 % GEL Apply 2 g topically 4 (four) times daily.     fluticasone (FLONASE) 50 MCG/ACT nasal spray Place into both nostrils daily as needed for allergies or rhinitis.     gabapentin  (NEURONTIN ) 300 MG capsule ONE TAB TWICE A DAY 180 capsule 3   Halobetasol  Propionate (LEXETTE ) 0.05 % FOAM APPLY TOPICALLY TO THE AFFECTED AREA TWICE DAILY AS NEEDED ITCHING up to two weeks. AVOID FACE, GROIN, underarms] 50 g 11   Multiple Vitamin (MULTI-VITAMIN) tablet Take 1 tablet by mouth daily.     naproxen sodium (ALEVE) 220 MG tablet Take 220 mg by mouth.     risankizumab -rzaa (SKYRIZI  PEN) 150 MG/ML pen Inject contents of 1 pen subcutaneously every 3 months 1 mL 1   Roflumilast  (ZORYVE ) 0.3 % CREA Use once daily to affected areas of psoriasis. 60 g 2   tadalafil  (CIALIS ) 20 MG tablet Take 0.5-1 tablets (10-20 mg total) by mouth every other day as needed for erectile dysfunction. 10 tablet 11   No current facility-administered medications on file prior to visit.    No Known Allergies  Past  Medical History:  Diagnosis Date   Allergy    Hyperlipidemia    Hypertension    Testosterone  deficiency    Dr Ike treated in past    Past Surgical History:  Procedure Laterality Date   CYSTECTOMY  03/2014   cyst removed from lower lumbar   VASECTOMY Bilateral 2011    Family History  Problem Relation Age of Onset   Skin cancer Mother    Heart disease Father    Diabetes Father     Social History   Socioeconomic History   Marital status: Divorced    Spouse name: Not on file   Number of children: 2   Years of education: Not on file   Highest education level: Not on file  Occupational History   Occupation: Postal service    Comment: Retired 2023  Tobacco Use   Smoking status: Never    Passive exposure: Past   Smokeless tobacco: Never  Substance and Sexual Activity   Alcohol use: Yes    Alcohol/week: 2.0 standard drinks of alcohol    Types: 2 Standard drinks or equivalent per week   Drug use: No   Sexual activity: Not on file  Other Topics Concern   Not on file  Social History Narrative   Divorced   Remarried 2012--then divorced again   Social Drivers of Health   Financial Resource Strain:  Not on file  Food Insecurity: Not on file  Transportation Needs: Not on file  Physical Activity: Not on file  Stress: Not on file  Social Connections: Not on file  Intimate Partner Violence: Not on file   Review of Systems No fever No cough Occ heartburn--rarely uses tums    Objective:   Physical Exam Constitutional:      Appearance: He is well-developed.   Cardiovascular:     Rate and Rhythm: Normal rate and regular rhythm.     Pulses: Normal pulses.     Heart sounds: No murmur heard.    No gallop.  Pulmonary:     Effort: Pulmonary effort is normal.     Breath sounds: Normal breath sounds. No wheezing or rales.  Abdominal:     Palpations: Abdomen is soft.     Tenderness: There is no abdominal tenderness. There is no guarding or rebound.    Musculoskeletal:     Cervical back: Neck supple.     Right lower leg: No edema.     Left lower leg: No edema.  Lymphadenopathy:     Cervical: No cervical adenopathy.   Neurological:     Mental Status: He is alert.   Psychiatric:        Mood and Affect: Mood normal.        Behavior: Behavior normal.     Comments: No obvious anxiety            Assessment & Plan:

## 2023-07-21 ENCOUNTER — Other Ambulatory Visit: Payer: Self-pay | Admitting: Internal Medicine

## 2023-07-21 DIAGNOSIS — R0602 Shortness of breath: Secondary | ICD-10-CM

## 2023-08-12 DIAGNOSIS — M19041 Primary osteoarthritis, right hand: Secondary | ICD-10-CM | POA: Diagnosis not present

## 2023-08-12 DIAGNOSIS — M19042 Primary osteoarthritis, left hand: Secondary | ICD-10-CM | POA: Diagnosis not present

## 2023-08-12 DIAGNOSIS — M1812 Unilateral primary osteoarthritis of first carpometacarpal joint, left hand: Secondary | ICD-10-CM | POA: Diagnosis not present

## 2023-08-12 DIAGNOSIS — M25542 Pain in joints of left hand: Secondary | ICD-10-CM | POA: Diagnosis not present

## 2023-08-12 DIAGNOSIS — M545 Low back pain, unspecified: Secondary | ICD-10-CM | POA: Diagnosis not present

## 2023-08-12 DIAGNOSIS — L409 Psoriasis, unspecified: Secondary | ICD-10-CM | POA: Diagnosis not present

## 2023-09-02 ENCOUNTER — Encounter: Payer: Self-pay | Admitting: Cardiovascular Disease

## 2023-09-02 ENCOUNTER — Ambulatory Visit: Attending: Cardiovascular Disease | Admitting: Cardiovascular Disease

## 2023-09-02 VITALS — BP 134/70 | HR 70 | Ht 72.0 in | Wt 220.0 lb

## 2023-09-02 DIAGNOSIS — R0602 Shortness of breath: Secondary | ICD-10-CM

## 2023-09-02 DIAGNOSIS — E782 Mixed hyperlipidemia: Secondary | ICD-10-CM

## 2023-09-02 DIAGNOSIS — I10 Essential (primary) hypertension: Secondary | ICD-10-CM

## 2023-09-02 NOTE — Assessment & Plan Note (Signed)
 History of essential hypertension with blood pressure measured measured today at 134/70.  He is not on antihypertensive medications.

## 2023-09-02 NOTE — Assessment & Plan Note (Signed)
 Several month history of shortness of breath at rest, does not manifest as dyspnea on exertion.  He is never smoked.  Apparently a chest x-ray showed NAD.  I am going to get a 2D echo to further evaluate.

## 2023-09-02 NOTE — Assessment & Plan Note (Signed)
 History of hyperlipidemia not on statin therapy with lipid profile performed 03/11/2023 revealing total cholesterol of 262, LDL of 181 and HDL of 66.  I am going to get a coronary calcium score to her stratify.  Ultimately, I suspect he will need to be on a statin drug.

## 2023-09-02 NOTE — Progress Notes (Signed)
 09/02/2023 Randy Dillon   [birth year]  [id number]  Primary Physician Randy Charlie FERNS, MD Primary Cardiologist: Randy JINNY Lesches MD Randy Dillon, [place]  HPI:  Randy Dillon is a 63 y.o. 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  He retired in 2023 after working 37 years there.  He is referred by Dr. Necia, his PCP, because of shortness of breath.  His cardiac risk factors notable for untreated hypertension and untreated hyperlipidemia.  There is no family history of heart disease.  Is never had a heart attack or stroke.  He denies chest pain but has noticed shortness of breath at rest over the last several months for unclear reasons that can last for hours at a time.  He apparently had a chest x-ray that showed NAD.  He has never smoked.   Current Meds  Medication Sig   cetirizine (ZYRTEC) 10 MG tablet Take 10 mg by mouth daily.   clobetasol  cream (TEMOVATE ) 0.05 % Apply to itchy areas twice daily as needed for rash. Avoid applying to face, groin, and axilla. Use as directed. Long-term use can cause thinning of the skin. (Patient taking differently: as needed (As need for rash). Apply to itchy areas twice daily as needed for rash. Avoid applying to face, groin, and axilla. Use as directed. Long-term use can cause thinning of the skin.)   diclofenac Sodium (VOLTAREN) 1 % GEL Apply 2 g topically 4 (four) times daily.   fluticasone (FLONASE) 50 MCG/ACT nasal spray Place into both nostrils daily as needed for allergies or rhinitis.   gabapentin  (NEURONTIN ) 300 MG capsule ONE TAB TWICE A DAY (Patient taking differently: Take 300 mg by mouth as needed (Pain). ONE TAB TWICE A DAY)   Halobetasol  Propionate (LEXETTE ) 0.05 % FOAM APPLY TOPICALLY TO THE AFFECTED AREA TWICE DAILY AS NEEDED ITCHING up to two weeks. AVOID FACE, GROIN, underarms]   Multiple Vitamin (MULTI-VITAMIN) tablet Take 1 tablet by mouth daily.   naproxen sodium  (ALEVE) 220 MG tablet Take 220 mg by mouth.   risankizumab -rzaa (SKYRIZI  PEN) 150 MG/ML pen Inject contents of 1 pen subcutaneously every 3 months   Roflumilast  (ZORYVE ) 0.3 % CREA Use once daily to affected areas of psoriasis.   tadalafil  (CIALIS ) 20 MG tablet Take 0.5-1 tablets (10-20 mg total) by mouth every other day as needed for erectile dysfunction.     No Known Allergies  Social History   Socioeconomic History   Marital status: Divorced    Spouse name: Not on file   Number of children: 2   Years of education: Not on file   Highest education level: Not on file  Occupational History   Occupation: Postal service    Comment: Retired 2023  Tobacco Use   Smoking status: Never    Passive exposure: Past   Smokeless tobacco: Never  Substance and Sexual Activity   Alcohol use: Yes    Alcohol/week: 2.0 standard drinks of alcohol    Types: 2 Standard drinks or equivalent per week   Drug use: No   Sexual activity: Not on file  Other Topics Concern   Not on file  Social History Narrative   Divorced   Remarried 2012--then divorced again   Social Drivers of Health   Financial Resource Strain: Not on file  Food Insecurity: Not on file  Transportation Needs: Not on file  Physical Activity: Not on file  Stress: Not on  file  Social Connections: Not on file  Intimate Partner Violence: Not on file     Review of Systems: General: negative for chills, fever, night sweats or weight changes.  Cardiovascular: negative for chest pain, dyspnea on exertion, edema, orthopnea, palpitations, paroxysmal nocturnal dyspnea or shortness of breath Dermatological: negative for rash Respiratory: negative for cough or wheezing Urologic: negative for hematuria Abdominal: negative for nausea, vomiting, diarrhea, bright red blood per rectum, melena, or hematemesis Neurologic: negative for visual changes, syncope, or dizziness All other systems reviewed and are otherwise negative except as noted  above.    Blood pressure 134/70, pulse 70, height 6' (1.829 m), weight 220 lb (99.8 kg), SpO2 96%.  General appearance: alert and no distress Neck: no adenopathy, no carotid bruit, no JVD, supple, symmetrical, trachea midline, and thyroid not enlarged, symmetric, no tenderness/mass/nodules Lungs: clear to auscultation bilaterally Heart: regular rate and rhythm, S1, S2 normal, no murmur, click, rub or gallop Extremities: extremities normal, atraumatic, no cyanosis or edema Pulses: 2+ and symmetric Skin: Skin color, texture, turgor normal. No rashes or lesions Neurologic: Grossly normal  EKG not performed today      ASSESSMENT AND PLAN:   Essential hypertension, benign History of essential hypertension with blood pressure measured measured today at 134/70.  He is not on antihypertensive medications.  Hyperlipidemia History of hyperlipidemia not on statin therapy with lipid profile performed 03/11/2023 revealing total cholesterol of 262, LDL of 181 and HDL of 66.  I am going to get a coronary calcium score to her stratify.  Ultimately, I suspect he will need to be on a statin drug.  SOB (shortness of breath) Several month history of shortness of breath at rest, does not manifest as dyspnea on exertion.  He is never smoked.  Apparently a chest x-ray showed NAD.  I am going to get a 2D echo to further evaluate.     Randy DOROTHA Lesches MD FACP,FACC,FAHA, [facility] 09/02/2023 11:24 AM

## 2023-09-02 NOTE — Patient Instructions (Signed)
 Medication Instructions:  Your physician recommends that you continue on your current medications as directed. Please refer to the Current Medication list given to you today.  *If you need a refill on your cardiac medications before your next appointment, please call your pharmacy*   Testing/Procedures: Your physician has requested that you have an echocardiogram. Echocardiography is a painless test that uses sound waves to create images of your heart. It provides your doctor with information about the size and shape of your heart and how well your heart's chambers and valves are working. This procedure takes approximately one hour. There are no restrictions for this procedure. Please do NOT wear cologne, perfume, aftershave, or lotions (deodorant is allowed). Please arrive 15 minutes prior to your appointment time.  Please note: We ask at that you not bring children with you during ultrasound (echo/ vascular) testing. Due to room size and safety concerns, children are not allowed in the ultrasound rooms during exams. Our front office staff cannot provide observation of children in our lobby area while testing is being conducted. An adult accompanying a patient to their appointment will only be allowed in the ultrasound room at the discretion of the ultrasound technician under special circumstances. We apologize for any inconvenience.   Dr. Court has ordered a CT coronary calcium score.   Test locations:  [facility] at [facility] [place] [facility]  This is $99 out of pocket.   Coronary CalciumScan A coronary calcium scan is an imaging test used to look for deposits of calcium and other fatty materials (plaques) in the inner lining of the blood vessels of the heart (coronary arteries). These deposits of calcium and plaques can partly clog and narrow the coronary arteries without  producing any symptoms or warning signs. This puts a person at risk for a heart attack. This test can detect these deposits before symptoms develop. Tell a health care provider about: Any allergies you have. All medicines you are taking, including vitamins, herbs, eye drops, creams, and over-the-counter medicines. Any problems you or family members have had with anesthetic medicines. Any blood disorders you have. Any surgeries you have had. Any medical conditions you have. Whether you are pregnant or may be pregnant. What are the risks? Generally, this is a safe procedure. However, problems may occur, including: Harm to a pregnant woman and her unborn baby. This test involves the use of radiation. Radiation exposure can be dangerous to a pregnant woman and her unborn baby. If you are pregnant, you generally should not have this procedure done. Slight increase in the risk of cancer. This is because of the radiation involved in the test. What happens before the procedure? No preparation is needed for this procedure. What happens during the procedure? You will undress and remove any jewelry around your neck or chest. You will put on a hospital gown. Sticky electrodes will be placed on your chest. The electrodes will be connected to an electrocardiogram (ECG) machine to record a tracing of the electrical activity of your heart. A CT scanner will take pictures of your heart. During this time, you will be asked to lie still and hold your breath for 2-3 seconds while a picture of your heart is being taken. The procedure may vary among health care providers and hospitals. What happens after the procedure? You can get dressed. You can return to your normal activities. It is up to you to  get the results of your test. Ask your health care provider, or the department that is doing the test, when your results will be ready. Summary A coronary calcium scan is an imaging test used to look for deposits of  calcium and other fatty materials (plaques) in the inner lining of the blood vessels of the heart (coronary arteries). Generally, this is a safe procedure. Tell your health care provider if you are pregnant or may be pregnant. No preparation is needed for this procedure. A CT scanner will take pictures of your heart. You can return to your normal activities after the scan is done. This information is not intended to replace advice given to you by your health care provider. Make sure you discuss any questions you have with your health care provider. Document Released: 07/06/2007 Document Revised: 11/27/2015 Document Reviewed: 11/27/2015 Elsevier Interactive Patient Education  2017 [employer].   Follow-Up: At [facility], you and your health needs are our priority.  As part of our continuing mission to provide you with exceptional heart care, our providers are all part of one team.  This team includes your primary Cardiologist (physician) and Advanced Practice Providers or APPs (Physician Assistants and Nurse Practitioners) who all work together to provide you with the care you need, when you need it.  Your next appointment:   We will see you on an as needed basis  Provider:   Dorn Lesches, MD

## 2023-09-30 ENCOUNTER — Encounter: Payer: Self-pay | Admitting: Dermatology

## 2023-09-30 ENCOUNTER — Other Ambulatory Visit: Payer: Self-pay | Admitting: Dermatology

## 2023-09-30 ENCOUNTER — Ambulatory Visit: Payer: BC Managed Care – PPO | Admitting: Dermatology

## 2023-09-30 DIAGNOSIS — W908XXA Exposure to other nonionizing radiation, initial encounter: Secondary | ICD-10-CM

## 2023-09-30 DIAGNOSIS — L281 Prurigo nodularis: Secondary | ICD-10-CM

## 2023-09-30 DIAGNOSIS — Z1283 Encounter for screening for malignant neoplasm of skin: Secondary | ICD-10-CM | POA: Diagnosis not present

## 2023-09-30 DIAGNOSIS — L578 Other skin changes due to chronic exposure to nonionizing radiation: Secondary | ICD-10-CM

## 2023-09-30 DIAGNOSIS — Z79899 Other long term (current) drug therapy: Secondary | ICD-10-CM | POA: Diagnosis not present

## 2023-09-30 DIAGNOSIS — L814 Other melanin hyperpigmentation: Secondary | ICD-10-CM | POA: Diagnosis not present

## 2023-09-30 DIAGNOSIS — Z7189 Other specified counseling: Secondary | ICD-10-CM

## 2023-09-30 DIAGNOSIS — Z808 Family history of malignant neoplasm of other organs or systems: Secondary | ICD-10-CM

## 2023-09-30 DIAGNOSIS — L821 Other seborrheic keratosis: Secondary | ICD-10-CM

## 2023-09-30 DIAGNOSIS — L409 Psoriasis, unspecified: Secondary | ICD-10-CM

## 2023-09-30 DIAGNOSIS — D229 Melanocytic nevi, unspecified: Secondary | ICD-10-CM

## 2023-09-30 DIAGNOSIS — D1801 Hemangioma of skin and subcutaneous tissue: Secondary | ICD-10-CM

## 2023-09-30 DIAGNOSIS — L280 Lichen simplex chronicus: Secondary | ICD-10-CM

## 2023-09-30 MED ORDER — SKYRIZI PEN 150 MG/ML ~~LOC~~ SOAJ: SUBCUTANEOUS | 3 refills | Status: AC

## 2023-09-30 NOTE — Progress Notes (Signed)
 Follow-Up Visit   Subjective  Randy Dillon is a 63 y.o. male who presents for the following: Skin Cancer Screening and Full Body Skin Exam  The patient presents for Total-Body Skin Exam (TBSE) for skin cancer screening and mole check. The patient has spots, moles and lesions to be evaluated, some may be new or changing and the patient may have concern these could be cancer.  No hx skin cancer. Patient with psoriasis, on Skyrizi  and patient doing well with no side effects. Occasionally uses clobetasol  prn flares. Sees Dr. Tobie for osteoarthritis.   The following portions of the chart were reviewed this encounter and updated as appropriate: medications, allergies, medical history  Review of Systems:  No other skin or systemic complaints except as noted in HPI or Assessment and Plan.  Objective  Well appearing patient in no apparent distress; mood and affect are within normal limits.  A full examination was performed including scalp, head, eyes, ears, nose, lips, neck, chest, axillae, abdomen, back, buttocks, bilateral upper extremities, bilateral lower extremities, hands, feet, fingers, toes, fingernails, and toenails. All findings within normal limits unless otherwise noted below.   Relevant physical exam findings are noted in the Assessment and Plan.    Assessment & Plan   SKIN CANCER SCREENING PERFORMED TODAY.  ACTINIC DAMAGE - Chronic condition, secondary to cumulative UV/sun exposure - diffuse scaly erythematous macules with underlying dyspigmentation - Recommend daily broad spectrum sunscreen SPF 30+ to sun-exposed areas, reapply every 2 hours as needed.  - Staying in the shade or wearing long sleeves, sun glasses (UVA+UVB protection) and wide brim hats (4-inch brim around the entire circumference of the hat) are also recommended for sun protection.  - Call for new or changing lesions.  LENTIGINES, SEBORRHEIC KERATOSES, HEMANGIOMAS - Benign normal skin lesions -  Benign-appearing - Call for any changes  MELANOCYTIC NEVI - Tan-brown and/or pink-flesh-colored symmetric macules and papules - Benign appearing on exam today - Observation - Call clinic for new or changing moles - Recommend daily use of broad spectrum spf 30+ sunscreen to sun-exposed areas.   PSORIASIS Exam: Well-demarcated erythematous papules/plaques L dorsal hand and 4th interdigital space <1% BSA.  Chronic and persistent condition with duration or expected duration over one year. Condition is improving with treatment but not currently at goal.  Patient sees Dr. Tobie for osteoarthritis.  Psoriasis is a chronic non-curable, but treatable genetic/hereditary disease that may have other systemic features affecting other organ systems such as joints (Psoriatic Arthritis). It is associated with an increased risk of inflammatory bowel disease, heart disease, non-alcoholic fatty liver disease, and depression.  Treatments include light and laser treatments; topical medications; and systemic medications including oral and injectables.  Treatment Plan: Continue Skyrizi  150 mg/mL SQ Q3 months.  Continue clobetasol  0.05% cr twice daily prn psoriasis. Avoid applying to face, groin, and axilla. Use as directed. Long-term use can cause thinning of the skin. Repeat quant gold  Reviewed risks of biologics including immunosuppression, infections (i.e. TB reactivation), injection site reaction, and failure to improve condition. Goal is control of skin condition, not cure.  Some older biologics such as Humira and Enbrel may slightly increase risk of malignancy and may worsen congestive heart failure.  Taltz, Cosentyx, and Bimzelx may cause inflammatory bowel disease to flare or may increase incidence of yeast infections. Skyrizi , Tremfya, and Stelara may also slightly increase risk of infection. The use of biologics requires long term medication management, including periodic office visits, annual TB  screening test and  monitoring of blood work.  Monitoring for Tuberculosis Infection:  Patient is currently receiving biologic therapy for psoriasis, which carries a potential risk for reactivation of tuberculosis infection. As part of ongoing safety monitoring, a QuantiFERON-TB Gold (QFT-G) test is checked prior to initiation of biologic therapy and yearly per guidelines. - Most recent QFT-G result:   09/25/22 - Will recheck yearly  - No signs or symptoms of active TB noted. - Will continue to monitor per standard biologic safety protocol. 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 Condition is symptomatic/ bothersome to patient. Not currently at goal.  Lichen simplex chronicus [facility]) is a persistent itchy area of thickened skin that is induced by chronic rubbing and/or scratching (chronic dermatitis).  These areas may be pink, hyperpigmented and may have excoriations and bumps (prurigo nodules-PN).  PN/LSC is commonly observed in uncontrolled atopic dermatitis and other forms of eczema, and in other itchy skin conditions (eg, insect bites, scabies).  Sometimes it is not possible to know initial cause of PN/LSC if it has been present for a long time.  It generally responds well to treatment with high potency topical steroids.  It is important to stop rubbing/scratching the area in order to break the itch-scratch-rash-itch cycle, in order for the rash to resolve.   Treatment Plan: Recommend clobetasol  cr twice daily as needed for itch. Avoid applying to face, groin, and axilla. Use as directed. Long-term use can cause thinning of the skin.  Avoid  picking/rubbing/scratching   PSORIASIS   Related Medications clobetasol  cream (TEMOVATE ) 0.05 % Apply to itchy areas twice daily as needed for rash. Avoid applying to face, groin, and axilla. Use as directed. Long-term use can cause thinning of the skin. risankizumab -rzaa (SKYRIZI  PEN) 150 MG/ML pen Inject contents of 1 pen subcutaneously every 3 months LONG-TERM USE OF HIGH-RISK MEDICATION   Related Procedures QuantiFERON-TB Gold Plus COUNSELING AND COORDINATION OF CARE   SEBORRHEIC KERATOSES   MULTIPLE BENIGN NEVI   LENTIGINES   ACTINIC ELASTOSIS   CHERRY ANGIOMA   PRURIGO NODULARIS   Return in about 1 year (around 09/29/2024) for TBSE, Psoriasis, with Dr. Claudene.  LILLETTE Lonell Drones, RMA, am acting as scribe for Boneta Claudene, MD .   Documentation: I have reviewed the above documentation for accuracy and completeness, and I agree with the above.  Boneta Claudene, MD

## 2023-09-30 NOTE — Patient Instructions (Signed)

## 2023-10-03 LAB — QUANTIFERON-TB GOLD PLUS
QuantiFERON Mitogen Value: >10 [IU]/mL
QuantiFERON Nil Value: 0.05 [IU]/mL
QuantiFERON TB1 Ag Value: 0.05 [IU]/mL
QuantiFERON TB2 Ag Value: 0.05 [IU]/mL
QuantiFERON-TB Gold Plus: NEGATIVE

## 2023-10-06 ENCOUNTER — Ambulatory Visit: Payer: Self-pay | Admitting: Dermatology

## 2023-10-09 ENCOUNTER — Ambulatory Visit: Payer: Self-pay | Admitting: Cardiovascular Disease

## 2023-10-09 ENCOUNTER — Ambulatory Visit (HOSPITAL_COMMUNITY)
Admission: RE | Admit: 2023-10-09 | Discharge: 2023-10-09 | Disposition: A | Discharge status: Home / Self Care | Payer: Self-pay | Source: Ambulatory Visit | Attending: Internal Medicine | Admitting: Internal Medicine

## 2023-10-09 ENCOUNTER — Ambulatory Visit (HOSPITAL_COMMUNITY)
Admission: RE | Admit: 2023-10-09 | Discharge: 2023-10-09 | Disposition: A | Discharge status: Home / Self Care | Source: Ambulatory Visit | Attending: Internal Medicine | Admitting: Internal Medicine

## 2023-10-09 DIAGNOSIS — I10 Essential (primary) hypertension: Secondary | ICD-10-CM | POA: Diagnosis not present

## 2023-10-09 DIAGNOSIS — R0602 Shortness of breath: Secondary | ICD-10-CM

## 2023-10-09 DIAGNOSIS — E782 Mixed hyperlipidemia: Secondary | ICD-10-CM

## 2023-10-09 DIAGNOSIS — R931 Abnormal findings on diagnostic imaging of heart and coronary circulation: Secondary | ICD-10-CM

## 2023-10-09 DIAGNOSIS — R911 Solitary pulmonary nodule: Secondary | ICD-10-CM | POA: Insufficient documentation

## 2023-10-09 LAB — ECHOCARDIOGRAM COMPLETE
Area-P 1/2: 2.74 cm2
S' Lateral: 2.8 cm

## 2023-10-17 MED ORDER — METOPROLOL TARTRATE 50 MG PO TABS: ORAL_TABLET | ORAL | 0 refills | Status: AC

## 2023-10-17 MED ORDER — ATORVASTATIN CALCIUM 40 MG PO TABS: 40.0000 mg | ORAL_TABLET | Freq: Every day | ORAL | 3 refills | Status: AC

## 2023-10-23 ENCOUNTER — Ambulatory Visit (HOSPITAL_COMMUNITY)
Admission: RE | Admit: 2023-10-23 | Discharge: 2023-10-23 | Disposition: A | Discharge status: Home / Self Care | Source: Ambulatory Visit | Attending: Cardiovascular Disease | Admitting: Cardiovascular Disease

## 2023-10-23 DIAGNOSIS — R9430 Abnormal result of cardiovascular function study, unspecified: Secondary | ICD-10-CM | POA: Diagnosis not present

## 2023-10-23 DIAGNOSIS — I700 Atherosclerosis of aorta: Secondary | ICD-10-CM | POA: Diagnosis not present

## 2023-10-23 DIAGNOSIS — E782 Mixed hyperlipidemia: Secondary | ICD-10-CM | POA: Insufficient documentation

## 2023-10-23 DIAGNOSIS — R931 Abnormal findings on diagnostic imaging of heart and coronary circulation: Secondary | ICD-10-CM | POA: Insufficient documentation

## 2023-10-23 DIAGNOSIS — R0602 Shortness of breath: Secondary | ICD-10-CM | POA: Diagnosis present

## 2023-10-23 DIAGNOSIS — I2510 Atherosclerotic heart disease of native coronary artery without angina pectoris: Secondary | ICD-10-CM | POA: Diagnosis not present

## 2023-10-23 MED ORDER — NITROGLYCERIN 0.4 MG SL SUBL
0.8000 mg | SUBLINGUAL_TABLET | Freq: Once | SUBLINGUAL | Status: AC
  Administered 2023-10-23: 0.8 mg via SUBLINGUAL

## 2023-10-23 MED ORDER — IOHEXOL 350 MG/ML SOLN
100.0000 mL | Freq: Once | INTRAVENOUS | Status: AC | PRN
  Administered 2023-10-23: 100 mL via INTRAVENOUS

## 2023-10-24 ENCOUNTER — Ambulatory Visit: Payer: Self-pay | Admitting: Cardiovascular Disease

## 2023-11-01 LAB — HEPATIC FUNCTION PANEL
ALT: 46 IU/L — ABNORMAL HIGH (ref 0–44)
AST: 33 IU/L (ref 0–40)
Albumin: 4.5 g/dL (ref 3.9–4.9)
Alkaline Phosphatase: 74 IU/L (ref 47–123)
Bilirubin Total: 1 mg/dL (ref 0.0–1.2)
Bilirubin, Direct: 0.29 mg/dL (ref 0.00–0.40)
Total Protein: 7.2 g/dL (ref 6.0–8.5)

## 2023-11-01 LAB — LIPID PANEL
Chol/HDL Ratio: 3.2 ratio (ref 0.0–5.0)
Cholesterol, Total: 177 mg/dL (ref 100–199)
HDL: 56 mg/dL (ref >39)
LDL Chol Calc (NIH): 98 mg/dL (ref 0–99)
Triglycerides: 130 mg/dL (ref 0–149)
VLDL Cholesterol Cal: 23 mg/dL (ref 5–40)

## 2023-12-31 ENCOUNTER — Other Ambulatory Visit: Payer: Self-pay

## 2023-12-31 DIAGNOSIS — E782 Mixed hyperlipidemia: Secondary | ICD-10-CM | POA: Diagnosis not present

## 2023-12-31 DIAGNOSIS — R931 Abnormal findings on diagnostic imaging of heart and coronary circulation: Secondary | ICD-10-CM

## 2023-12-31 LAB — HEPATIC FUNCTION PANEL
ALT: 52 IU/L — ABNORMAL HIGH (ref 0–44)
AST: 35 IU/L (ref 0–40)
Albumin: 4.6 g/dL (ref 3.9–4.9)
Alkaline Phosphatase: 84 IU/L (ref 47–123)
Bilirubin Total: 1 mg/dL (ref 0.0–1.2)
Bilirubin, Direct: 0.27 mg/dL (ref 0.00–0.40)
Total Protein: 7.1 g/dL (ref 6.0–8.5)

## 2023-12-31 LAB — LIPID PANEL
Chol/HDL Ratio: 3.6 ratio (ref 0.0–5.0)
Cholesterol, Total: 186 mg/dL (ref 100–199)
HDL: 52 mg/dL (ref >39)
LDL Chol Calc (NIH): 108 mg/dL — ABNORMAL HIGH (ref 0–99)
Triglycerides: 149 mg/dL (ref 0–149)
VLDL Cholesterol Cal: 26 mg/dL (ref 5–40)

## 2023-12-31 NOTE — Progress Notes (Signed)
 Orders placed for labs based on starting atorvastatin  40mg  daily on 9/26 based on elevated calcium  score recommendations per Dr. Court.

## 2024-01-01 ENCOUNTER — Ambulatory Visit: Payer: Self-pay | Admitting: Cardiovascular Disease

## 2024-01-01 DIAGNOSIS — E782 Mixed hyperlipidemia: Secondary | ICD-10-CM

## 2024-01-13 ENCOUNTER — Telehealth: Payer: Self-pay

## 2024-01-13 ENCOUNTER — Ambulatory Visit

## 2024-01-13 ENCOUNTER — Other Ambulatory Visit (HOSPITAL_COMMUNITY): Payer: Self-pay

## 2024-01-13 ENCOUNTER — Telehealth: Payer: Self-pay | Admitting: Pharmacy Technician

## 2024-01-13 DIAGNOSIS — E782 Mixed hyperlipidemia: Secondary | ICD-10-CM

## 2024-01-13 NOTE — Progress Notes (Signed)
 Patient ID: Randy Dillon                 DOB: [birth year]                    MRN: [id number]      HPI: Randy Dillon is a 63 y.o. male patient referred to lipid clinic by Dr. Court. PMH is significant for HLD, HTN, and elevated CCS.   The patient was last evaluated by Dr. Court in August 2025 for hypertension, hyperlipidemia, and shortness of breath. At that visit, the most recent lipid panel (from February 2025) showed LDL of 181 mg/dL and HDL of 66 mg/dL. A coronary calcium  score was ordered, which resulted in a CCS of 775 and atorvastatin  40 mg daily was initiated then (09/2023). A subsequent CCTA revealed a CCS of 525 without evidence of flow-limiting disease. The patient was counseled that the elevated calcium  score indicates increased risk but is not currently causing blood flow obstruction.  A repeat lipid panel performed three months after starting atorvastatin  showed LDL of 108 mg/dL, still above the goal of <70 mg/dL, and ALT was mildly elevated at 52 U/L. The patient was referred to the PharmD lipid clinic for further management.  Today, the patient presents in good spirits. He continues atorvastatin  40 mg daily without side effects. He acknowledges that his diet could improve and reports regaining approximately 25 pounds after previously losing weight due to returning to poor eating habits, including pasta and other carbohydrates. He is motivated to make changes and plans to eat healthier. Additionally, he wishes to reduce alcohol intake, currently consuming about four alcoholic beverages four days per week. Reports smoking marijuana twice per week.  Reviewed options for lowering LDL cholesterol, including ezetimibe, PCSK-9 inhibitors. Discussed mechanisms of action, dosing, side effects and potential decreases in LDL cholesterol.  Also reviewed cost information and potential options for patient assistance.   Current Medications: atorvastatin  40 mg daily Intolerances: none Risk Factors: HTN, CCS  525 (10/24/2023) LDL goal: < 70 Lipid panel (12/2023): Chol 186, Trig 149, HDL 52 LDL 108 Liver enzymes (12/2023): ALT 52, AST 35, Alk phos 84  Diet:  Diet is high in carbohydrates, especially pasta. Snacks typically include cheese, nuts, and popcorn. Beverages are mainly diet sodas such as Diet Coke or Coke Zero  Exercise:  Patient is a gym member and reports exercising daily for two weeks at a time, followed by a two-week break.  Family History:  Relation Problem Comments  Mother Metallurgist) Skin cancer     Father (Deceased) Diabetes   Heart disease     Sister Metallurgist)   Brother Metallurgist)     Social History:  Alcohol: four alcoholic beverages four days per week. Smoking: no tobacco use; smokes marijuana 2 nights per week  Labs:  Lipid Panel     Component Value Date/Time   CHOL 186 12/31/2023 1053   TRIG 149 12/31/2023 1053   HDL 52 12/31/2023 1053   CHOLHDL 3.6 12/31/2023 1053   CHOLHDL 4 03/11/2023 1037   VLDL 14.8 03/11/2023 1037   LDLCALC 108 (H) 12/31/2023 1053   LDLDIRECT 178.0 05/10/2020 1156   LABVLDL 26 12/31/2023 1053    Past Medical History:  Diagnosis Date   Allergy    Hyperlipidemia    Hypertension    Testosterone  deficiency    Dr Ike treated in past    Medications Ordered Prior to Encounter[1]  Allergies[2]  Assessment/Plan:  1. Hyperlipidemia -  Problem  Hyperlipidemia   Hyperlipidemia    Hyperlipidemia Assessment:  LDL goal: < 70  mg/dl; last LDLc 891 mg/dl (87/7974) ALT mildly elevated at 56; Alk phos and AST wnl; will continue statin therapy at this time as ALT < 3 X ULN and monitor CCTA (10/2023) showed CCS of 525 without evidence of flow-limiting disease.  Tolerates atorvastatin  well without any side effects  Discussed next potential options (PCSK-9 inhibitors, ezetimibe, bempedoic acid and inclisiran); cost, dosing efficacy, side effects  Patient willing to proceed with PCSK9i therapy Reinforced heart-healthy diet and encouraged  gradual increase in physical activity a few times per week without overexertion  Plan: Continue taking atorvastatin  40 mg daily  Will apply for PA for PCSK9i; will inform patient upon approval  Lipid lab and hepatic panel due in 3 months after starting PCSK9i     Thank you,  Randy Dillon, Pharm.D, CPP [place] Elspeth BIRCH. [facility] [address], [place], [place] [address] Phone: [phone]; Fax: [phone]        [1]  Current Outpatient Medications on File Prior to Visit  Medication Sig Dispense Refill   atorvastatin  (LIPITOR) 40 MG tablet Take 1 tablet (40 mg total) by mouth daily. 90 tablet 3   cetirizine (ZYRTEC) 10 MG tablet Take 10 mg by mouth daily.     clobetasol  cream (TEMOVATE ) 0.05 % Apply to itchy areas twice daily as needed for rash. Avoid applying to face, groin, and axilla. Use as directed. Long-term use can cause thinning of the skin. (Patient taking differently: as needed (As need for rash). Apply to itchy areas twice daily as needed for rash. Avoid applying to face, groin, and axilla. Use as directed. Long-term use can cause thinning of the skin.) 30 g 0   diclofenac Sodium (VOLTAREN) 1 % GEL Apply 2 g topically 4 (four) times daily.     fluticasone (FLONASE) 50 MCG/ACT nasal spray Place into both nostrils daily as needed for allergies or rhinitis.     gabapentin  (NEURONTIN ) 300 MG capsule ONE TAB TWICE A DAY (Patient taking differently: Take 300 mg by mouth as needed (Pain). ONE TAB TWICE A DAY) 180 capsule 3   metoprolol  tartrate (LOPRESSOR ) 50 MG tablet Take 50 mg (1 tablet) TWO hours prior to CT scan 1 tablet 0   Multiple Vitamin (MULTI-VITAMIN) tablet Take 1 tablet by mouth daily.     naproxen sodium (ALEVE) 220 MG tablet Take 220 mg by mouth.     risankizumab -rzaa (SKYRIZI  PEN) 150 MG/ML pen Inject contents of 1 pen subcutaneously every 3 months 1 mL 3   tadalafil  (CIALIS ) 20 MG tablet Take 0.5-1 tablets (10-20 mg  total) by mouth every other day as needed for erectile dysfunction. 10 tablet 11   No current facility-administered medications on file prior to visit.  [2] No Known Allergies

## 2024-01-13 NOTE — Patient Instructions (Signed)
 Your Results:             Your most recent labs Goal  Total Cholesterol 186 < 200  Triglycerides 149 < 150  HDL (happy/good cholesterol) 52 > 40  LDL (lousy/bad cholesterol  108 < 70   Medication changes: Continue atorvastatin  40 mg daily We will start the process to get Repatha/Praluent covered by your insurance.  Once the prior authorization is complete, we will call you to let you know and confirm pharmacy information.  Lab orders:We want to repeat labs 3 months after starting PCSK9i.  We will send you a lab order to remind you once we get        closer to that time.    Randy Dillon Randy Dillon, Pharm.D, CPP [place] Randy Dillon. [facility] [address], [place], [place] [address] Phone: [phone]; Fax: [phone]     Praluent is a cholesterol medication that improved your body's ability to get rid of bad cholesterol known as LDL. It can lower your LDL up to 60%. It is an injection that is given under the skin every 2 weeks. The most common side effects of Praluent include runny nose, symptoms of the common cold, rarely flu or flu-like symptoms, back/muscle pain in about 3-4% of the patients, and redness, pain, or bruising at the injection site.  Repatha is a cholesterol medication that improved your body's ability to get rid of bad cholesterol known as LDL. It can lower your LDL up to 60%! It is an injection that is given under the skin every 2 weeks. The medication often requires a prior authorization from your insurance company. We will take care of submitting all the necessary information to your insurance company to get it approved. The most common side effects of Repatha include runny nose, symptoms of the common cold, rarely flu or flu-like symptoms, back/muscle pain in about 3-4% of the patients, and redness, pain, or bruising at the injection site.  It is also recommended that patients with high cholesterol adhere to a heart healthy diet, get  regular exercise, avoid use of tobacco products, and maintain a healthy weight. Steps that you can take to help in these areas:  Limit consumption of trans fats, saturated fats, and cholesterol in your diet  Increase intake of lean meats such as chicken, turkey, and fish  Increase intake of foods rich in fiber such as fresh fruits, vegetables, beans and oatmeal Exercise as you are able; even 30 minutes of walking daily can aid in increasing heart health

## 2024-01-13 NOTE — Telephone Encounter (Signed)
 Pharmacy Patient Advocate Encounter   Received notification from Physician's Office that prior authorization for repatha is required/requested.   Insurance verification completed.   The patient is insured through [employer].   Per test claim: PA required; PA submitted to above mentioned insurance via Latent Key/confirmation #[id number] Status is pending

## 2024-01-13 NOTE — Telephone Encounter (Signed)
 Please see other encounter.

## 2024-01-13 NOTE — Assessment & Plan Note (Signed)
 Assessment:  LDL goal: < 70  mg/dl; last LDLc 891 mg/dl (87/7974) ALT mildly elevated at 56; Alk phos and AST wnl; will continue statin therapy at this time as ALT < 3 X ULN and monitor CCTA (10/2023) showed CCS of 525 without evidence of flow-limiting disease.  Tolerates atorvastatin  well without any side effects  Discussed next potential options (PCSK-9 inhibitors, ezetimibe, bempedoic acid and inclisiran); cost, dosing efficacy, side effects  Patient willing to proceed with PCSK9i therapy Reinforced heart-healthy diet and encouraged gradual increase in physical activity a few times per week without overexertion  Plan: Continue taking atorvastatin  40 mg daily  Will apply for PA for PCSK9i; will inform patient upon approval  Lipid lab and hepatic panel due in 3 months after starting PCSK9i

## 2024-01-16 ENCOUNTER — Other Ambulatory Visit (HOSPITAL_COMMUNITY): Payer: Self-pay

## 2024-01-16 NOTE — Telephone Encounter (Signed)
 Pharmacy Patient Advocate Encounter  Received notification from [facility] that Prior Authorization for repatha has been APPROVED from 12/14/23 to 01/12/25. Ran test claim, Copay is $75.00- one month. This test claim was processed through [facility]- copay amounts may vary at other pharmacies due to pharmacy/plan contracts, or as the patient moves through the different stages of their insurance plan.   PA #/Case ID/Reference #: in media

## 2024-01-20 MED ORDER — REPATHA SURECLICK 140 MG/ML ~~LOC~~ SOAJ: 140.0000 mg | SUBCUTANEOUS | 3 refills | Status: AC

## 2024-01-20 NOTE — Addendum Note (Signed)
 Addended by: Abdulhadi Stopa E on: 01/20/2024 11:46 AM   Modules accepted: Orders

## 2024-01-20 NOTE — Telephone Encounter (Signed)
 Called patient to discuss Repatha copay and provided instructions to obtain Repatha copay card online. Rx sent to [facility] pharmacy as instructed.

## 2024-03-11 ENCOUNTER — Encounter

## 2024-10-05 ENCOUNTER — Ambulatory Visit: Admitting: Dermatology
